# Patient Record
Sex: Female | Born: 1970 | Race: White | Hispanic: No | Marital: Married | Smoking: Never smoker
Health system: Southern US, Community
[De-identification: ages and names within clinical notes are randomized; demographics above are authoritative.]

---

## 2017-01-17 DIAGNOSIS — E785 Hyperlipidemia, unspecified: Secondary | ICD-10-CM | POA: Insufficient documentation

## 2017-10-06 ENCOUNTER — Other Ambulatory Visit: Payer: Self-pay

## 2017-10-06 ENCOUNTER — Emergency Department (HOSPITAL_COMMUNITY): Payer: Commercial Managed Care - PPO

## 2017-10-06 ENCOUNTER — Emergency Department (HOSPITAL_COMMUNITY)
Admission: EM | Admit: 2017-10-06 | Discharge: 2017-10-07 | Disposition: A | Discharge status: Home / Self Care | Payer: Commercial Managed Care - PPO | Attending: Emergency Medicine | Admitting: Emergency Medicine

## 2017-10-06 DIAGNOSIS — K2100 Gastro-esophageal reflux disease with esophagitis, without bleeding: Secondary | ICD-10-CM

## 2017-10-06 DIAGNOSIS — R079 Chest pain, unspecified: Secondary | ICD-10-CM

## 2017-10-06 LAB — CBC
HEMATOCRIT: 39 % (ref 36.0–46.0)
HEMOGLOBIN: 13.2 g/dL (ref 12.0–15.0)
MCH: 28.5 pg (ref 26.0–34.0)
MCHC: 33.8 g/dL (ref 30.0–36.0)
MCV: 84.2 fL (ref 78.0–100.0)
Platelets: 229 10*3/uL (ref 150–400)
RBC: 4.63 MIL/uL (ref 3.87–5.11)
RDW: 13.2 % (ref 11.5–15.5)
WBC: 8.4 10*3/uL (ref 4.0–10.5)

## 2017-10-06 LAB — I-STAT TROPONIN, ED: Troponin i, poc: 0 ng/mL (ref 0.00–0.08)

## 2017-10-06 LAB — I-STAT BETA HCG BLOOD, ED (MC, WL, AP ONLY)

## 2017-10-06 NOTE — ED Notes (Signed)
Blue top drawn in triage.

## 2017-10-06 NOTE — ED Triage Notes (Signed)
Pt reports intermittent chest pain starting yesterday. She states that it is a heaviness accompanied by dizziness and nausea. A&Ox4. Family hx of abnormal cardia rhythms.

## 2017-10-07 DIAGNOSIS — K210 Gastro-esophageal reflux disease with esophagitis: Secondary | ICD-10-CM | POA: Diagnosis not present

## 2017-10-07 LAB — BASIC METABOLIC PANEL
ANION GAP: 7 (ref 5–15)
BUN: 10 mg/dL (ref 6–20)
CHLORIDE: 108 mmol/L (ref 101–111)
CO2: 25 mmol/L (ref 22–32)
Calcium: 9 mg/dL (ref 8.9–10.3)
Creatinine, Ser: 0.77 mg/dL (ref 0.44–1.00)
GFR calc Af Amer: >60 mL/min (ref >60)
GFR calc non Af Amer: >60 mL/min (ref >60)
Glucose, Bld: 132 mg/dL — ABNORMAL HIGH (ref 65–99)
POTASSIUM: 3.4 mmol/L — AB (ref 3.5–5.1)
Sodium: 140 mmol/L (ref 135–145)

## 2017-10-07 LAB — I-STAT TROPONIN, ED: Troponin i, poc: 0 ng/mL (ref 0.00–0.08)

## 2017-10-07 MED ORDER — GI COCKTAIL ~~LOC~~
30.0000 mL | Freq: Once | ORAL | Status: AC
  Administered 2017-10-07: 30 mL via ORAL
  Filled 2017-10-07: qty 30

## 2017-10-07 MED ORDER — OMEPRAZOLE 20 MG PO CPDR: 20.0000 mg | DELAYED_RELEASE_CAPSULE | Freq: Two times a day (BID) | ORAL | 1 refills | Status: DC

## 2017-10-07 MED ORDER — FAMOTIDINE IN NACL 20-0.9 MG/50ML-% IV SOLN
20.0000 mg | Freq: Once | INTRAVENOUS | Status: AC
  Administered 2017-10-07: 20 mg via INTRAVENOUS
  Filled 2017-10-07: qty 50

## 2017-10-07 NOTE — Discharge Instructions (Addendum)
Prilosec twice per day until symptoms are improving. Small, frequent meals.  Avoid large meals.  No large meals or liquids 2 hours before bed If symptoms persist, contact cardiologist for outpatient appointment. Return to ER with worsening symptoms

## 2017-10-07 NOTE — ED Provider Notes (Signed)
[facility] DEPT Provider Note   CSN: [id number] Arrival date & time: 10/06/17  2244     History   Chief Complaint Chief Complaint  Patient presents with  . Chest Pain    HPI Krista Jackson is a 47 y.o. female.  Chief complaint is chest pain.  HPI 47 year old female.  No history of heart disease.  No risk factors for heart disease.  Describes 3 weeks of intermittent symptoms.  States primarily when she lays back she will get symptoms.  It is a sensation of fullness in her chest.  She will occasionally feel some lightheadedness, and sweatiness.  She had an episode in triage and her heart rate went from 7230 and resolved within 2 to 3 minutes.  She states her most significant episode was earlier tonight she laid down flat felt a sudden burning in her chest and then felt lightheadedness and sweats.  Resolved with sitting up.  No history hypertension, diabetes, lifetime non-smoker.  No history of high cholesterol, family history negative for heart disease.  Does not consume excess alcohol or caffeine, non-smoker.  No NSAID use.  No steroids or antibiotics recently.  No past medical history on file.  There are no active problems to display for this patient.     OB History   None      Home Medications    Prior to Admission medications   Medication Sig Start Date End Date Taking? Authorizing Provider  omeprazole (PRILOSEC) 20 MG capsule Take 1 capsule (20 mg total) by mouth 2 (two) times daily. 10/07/17   Tanna Furry, MD    Family History No family history on file.  Social History Social History   Tobacco Use  . Smoking status: Not on file  Substance Use Topics  . Alcohol use: Not on file  . Drug use: Not on file     Allergies   Patient has no known allergies.   Review of Systems Review of Systems  Constitutional: Positive for diaphoresis. Negative for appetite change, chills, fatigue and fever.  HENT: Negative for mouth sores,  sore throat and trouble swallowing.   Eyes: Negative for visual disturbance.  Respiratory: Negative for cough, chest tightness, shortness of breath and wheezing.   Cardiovascular: Positive for chest pain.  Gastrointestinal: Positive for nausea. Negative for abdominal distention, abdominal pain, diarrhea and vomiting.  Endocrine: Negative for polydipsia, polyphagia and polyuria.  Genitourinary: Negative for dysuria, frequency and hematuria.  Musculoskeletal: Negative for gait problem.  Skin: Negative for color change, pallor and rash.  Neurological: Positive for light-headedness. Negative for dizziness, syncope and headaches.  Hematological: Does not bruise/bleed easily.  Psychiatric/Behavioral: Negative for behavioral problems and confusion.     Physical Exam Updated Vital Signs BP 98/70   Pulse (!) 56   Temp 97.9 F (36.6 C) (Oral)   Resp 18   LMP 09/22/2017   SpO2 97%   Physical Exam  Constitutional: She is oriented to person, place, and time. She appears well-developed and well-nourished. No distress.  HENT:  Head: Normocephalic.  Eyes: Pupils are equal, round, and reactive to light. Conjunctivae are normal. No scleral icterus.  Neck: Normal range of motion. Neck supple. No thyromegaly present.  Cardiovascular: Normal rate and regular rhythm. Exam reveals no gallop and no friction rub.  No murmur heard. Pulmonary/Chest: Effort normal and breath sounds normal. No respiratory distress. She has no wheezes. She has no rales.  Abdominal: Soft. Bowel sounds are normal. She exhibits no distension. There is no  tenderness. There is no rebound.  Musculoskeletal: Normal range of motion.  Neurological: She is alert and oriented to person, place, and time.  Skin: Skin is warm and dry. No rash noted.  Psychiatric: She has a normal mood and affect. Her behavior is normal.     ED Treatments / Results  Labs (all labs ordered are listed, but only abnormal results are displayed) Labs  Reviewed  BASIC METABOLIC PANEL - Abnormal; Notable for the following components:      Result Value   Potassium 3.4 (*)    Glucose, Bld 132 (*)    All other components within normal limits  CBC  I-STAT TROPONIN, ED  I-STAT BETA HCG BLOOD, ED ([facility], [facility], [facility] ONLY)  I-STAT TROPONIN, ED    EKG EKG Interpretation  Date/Time:  Friday Oct 06 2017 23:00:44 EDT Ventricular Rate:  89 PR Interval:    QRS Duration: 86 QT Interval:  390 QTC Calculation: 475 R Axis:   76 Text Interpretation:  Sinus rhythm Confirmed by Tanna Furry [phone]) on 10/06/2017 11:23:11 PM   Radiology Dg Chest 2 View  Result Date: 10/06/2017 CLINICAL DATA:  47 year old female with chest pain and shortness of breath. EXAM: CHEST - 2 VIEW COMPARISON:  None. FINDINGS: There is mild centrilobular emphysema and chronic bronchitic changes. No focal consolidation, pleural effusion, or pneumothorax. Biapical pleural thickening. The cardiac silhouette is within normal limits. No acute osseous pathology. IMPRESSION: No active cardiopulmonary disease. Electronically Signed   By: Anner Crete M.D.   On: 10/06/2017 23:40    Procedures Procedures (including critical care time)  Medications Ordered in ED Medications  gi cocktail (Maalox,Lidocaine,Donnatal) (30 mLs Oral Given 10/07/17 0246)  famotidine (PEPCID) IVPB 20 mg premix (0 mg Intravenous Stopped 10/07/17 0345)     Initial Impression / Assessment and Plan / ED Course  I have reviewed the triage vital signs and the nursing notes.  Pertinent labs & imaging results that were available during my care of the patient were reviewed by me and considered in my medical decision making (see chart for details).    EKG Interpretation  Date/Time:  Friday Oct 06 2017 23:00:44 EDT Ventricular Rate:  89 PR Interval:    QRS Duration: 86 QT Interval:  390 QTC Calculation: 475 R Axis:   76 Text Interpretation:  Sinus rhythm Confirmed by Tanna Furry [phone]) on 10/06/2017 11:23:11  PM   Initial, and second troponin negative.  Chest x-ray and labs negative.  Symptoms consistent with reflux and likely vagal syndrome.  Heart score 0.  Plan is discharge home.  Primary care or cardiologist symptoms worsen.  Given GI cocktail and IV Protonix here.  Will continue on Prilosec at home.  Recheck to ER with worsening.   Final Clinical Impressions(s) / ED Diagnoses   Final diagnoses:  Chest pain, unspecified type  Gastroesophageal reflux disease with esophagitis    ED Discharge Orders        Ordered    omeprazole (PRILOSEC) 20 MG capsule  2 times daily     10/07/17 0231       Tanna Furry, MD 10/07/17 [phone]

## 2017-10-07 NOTE — ED Notes (Signed)
Pt has been informed and provided discharge instructions. Currently waiting for Pepcid to complete

## 2017-11-03 DIAGNOSIS — Z0000 Encounter for general adult medical examination without abnormal findings: Secondary | ICD-10-CM | POA: Diagnosis not present

## 2017-11-06 DIAGNOSIS — Z0000 Encounter for general adult medical examination without abnormal findings: Secondary | ICD-10-CM | POA: Diagnosis not present

## 2017-11-06 DIAGNOSIS — M25512 Pain in left shoulder: Secondary | ICD-10-CM | POA: Diagnosis not present

## 2017-11-06 DIAGNOSIS — R7301 Impaired fasting glucose: Secondary | ICD-10-CM | POA: Diagnosis not present

## 2017-11-06 DIAGNOSIS — E785 Hyperlipidemia, unspecified: Secondary | ICD-10-CM | POA: Diagnosis not present

## 2017-11-06 DIAGNOSIS — Z124 Encounter for screening for malignant neoplasm of cervix: Secondary | ICD-10-CM | POA: Diagnosis not present

## 2017-11-06 DIAGNOSIS — Z1231 Encounter for screening mammogram for malignant neoplasm of breast: Secondary | ICD-10-CM | POA: Diagnosis not present

## 2017-11-06 LAB — HM PAP SMEAR: HM Pap smear: NEGATIVE

## 2017-11-29 DIAGNOSIS — M25512 Pain in left shoulder: Secondary | ICD-10-CM | POA: Diagnosis not present

## 2017-11-29 DIAGNOSIS — M542 Cervicalgia: Secondary | ICD-10-CM | POA: Diagnosis not present

## 2017-11-29 DIAGNOSIS — M7522 Bicipital tendinitis, left shoulder: Secondary | ICD-10-CM | POA: Diagnosis not present

## 2018-01-02 DIAGNOSIS — M25512 Pain in left shoulder: Secondary | ICD-10-CM | POA: Diagnosis not present

## 2018-01-02 DIAGNOSIS — M6281 Muscle weakness (generalized): Secondary | ICD-10-CM | POA: Diagnosis not present

## 2018-01-19 DIAGNOSIS — M25512 Pain in left shoulder: Secondary | ICD-10-CM | POA: Diagnosis not present

## 2018-01-19 DIAGNOSIS — Z1231 Encounter for screening mammogram for malignant neoplasm of breast: Secondary | ICD-10-CM | POA: Diagnosis not present

## 2018-01-19 DIAGNOSIS — M6281 Muscle weakness (generalized): Secondary | ICD-10-CM | POA: Diagnosis not present

## 2018-01-19 LAB — HM MAMMOGRAPHY

## 2018-01-25 DIAGNOSIS — L03012 Cellulitis of left finger: Secondary | ICD-10-CM | POA: Diagnosis not present

## 2018-07-09 ENCOUNTER — Ambulatory Visit: Payer: Commercial Managed Care - PPO | Admitting: Nurse Practitioner

## 2018-07-13 ENCOUNTER — Encounter: Payer: Self-pay | Admitting: Nurse Practitioner

## 2018-07-13 ENCOUNTER — Ambulatory Visit: Payer: Commercial Managed Care - PPO | Admitting: Nurse Practitioner

## 2018-07-13 VITALS — BP 110/64 | HR 74 | Temp 97.9°F | Ht 68.0 in | Wt 175.0 lb

## 2018-07-13 DIAGNOSIS — G8929 Other chronic pain: Secondary | ICD-10-CM | POA: Insufficient documentation

## 2018-07-13 DIAGNOSIS — M5441 Lumbago with sciatica, right side: Secondary | ICD-10-CM

## 2018-07-13 DIAGNOSIS — M5442 Lumbago with sciatica, left side: Secondary | ICD-10-CM | POA: Diagnosis not present

## 2018-07-13 NOTE — Patient Instructions (Signed)
Let me know if you change your mind about lumbar x-ray and physical therapy.  Stretch before and after any exercise routine.   Core Strength Exercises  Core exercises help to build strength in the muscles between your ribs and your hips (abdominal muscles). These muscles help to support your body and keep your spine stable. It is important to maintain strength in your core to prevent injury and pain. Some activities, such as yoga and Pilates, can help to strengthen core muscles. You can also strengthen core muscles with exercises at home. It is important to talk to your health care provider before you start a new exercise routine. What are the benefits of core strength exercises? Core strength exercises can:  Reduce back pain.  Help to rebuild strength after a back or spine injury.  Help to prevent injury during physical activity, especially injuries to the back and knees. How to do core strength exercises Repeat these exercises 10-15 times, or until you are tired. Do exercises exactly as told by your health care provider and adjust them as directed. It is normal to feel mild stretching, pulling, tightness, or discomfort as you do these exercises. If you feel any pain while doing these exercises, stop. If your pain continues or gets worse when doing core exercises, contact your health care provider. You may want to use a padded yoga or exercise mat for strength exercises that are done on the floor. Bridging  1. Lie on your back on a firm surface with your knees bent and your feet flat on the floor. 2. Raise your hips so that your knees, hips, and shoulders form a straight line together. Keep your abdominal muscles tight. 3. Hold this position for 3-5 seconds. 4. Slowly lower your hips to the starting position. 5. Let your muscles relax completely between repetitions. Single-leg bridge 1. Lie on your back on a firm surface with your knees bent and your feet flat on the floor. 2. Raise  your hips so that your knees, hips, and shoulders form a straight line together. Keep your abdominal muscles tight. 3. Lift one foot off the floor, then completely straighten that leg. 4. Hold this position for 3-5 seconds. 5. Put the straight leg back down in the bent position. 6. Slowly lower your hips to the starting position. 7. Repeat these steps using your other leg. Side bridge 1. Lie on your side with your knees bent. Prop yourself up on the elbow that is near the floor. 2. Using your abdominal muscles and your elbow that is on the floor, raise your body off the floor. Raise your hip so that your shoulder, hip, and foot form a straight line together. 3. Hold this position for 10 seconds. Keep your head and neck raised and away from your shoulder (in their normal, neutral position). Keep your abdominal muscles tight. 4. Slowly lower your hip to the starting position. 5. Repeat and try to hold this position longer, working your way up to 30 seconds. Abdominal crunch 1. Lie on your back on a firm surface. Bend your knees and keep your feet flat on the floor. 2. Cross your arms over your chest. 3. Without bending your neck, tip your chin slightly toward your chest. 4. Tighten your abdominal muscles as you lift your chest just high enough to lift your shoulder blades off of the floor. Do not hold your breath. You can do this with short lifts or long lifts. 5. Slowly return to the starting position. Bird dog  1. Get on your hands and knees, with your legs shoulder-width apart and your arms under your shoulders. Keep your back straight. 2. Tighten your abdominal muscles. 3. Raise one of your legs off the floor and straighten it. Try to keep it parallel to the floor. 4. Slowly lower your leg to the starting position. 5. Raise one of your arms off the floor and straighten it. Try to keep it parallel to the floor. 6. Slowly lower your arm to the starting position. 7. Repeat with the other arm  and leg. If possible, try raising a leg and arm at the same time, on opposite sides of the body. For example, raise your left hand and your right leg. Plank 1. Lie on your belly. 2. Prop up your body onto your forearms and your feet, keeping your legs straight. Your body should make a straight line between your shoulders and feet. 3. Hold this position for 10 seconds while keeping your abdominal muscles tight. 4. Lower your body to the starting position. 5. Repeat and try to hold this position longer, working your way up to 30 seconds. Cross-core strengthening 1. Stand with your feet shoulder-width apart. 2. Hold a ball out in front of you. Keep your arms straight. 3. Tighten your abdominal muscles and slowly rotate at your waist from side to side. Keep your feet flat. 4. Once you are comfortable, try repeating this exercise with a heavier ball. Top core strengthening 1. Stand about 18 inches (46 cm) in front of a wall, with your back to the wall. 2. Keep your feet flat and shoulder-width apart. 3. Tighten your abdominal muscles. 4. Bend your hips and knees. 5. Slowly reach between your legs to touch the wall behind you. 6. Slowly stand back up. 7. Raise your arms over your head and reach behind you. 8. Return to the starting position. General tips  Do not do any exercises that cause pain. If you have pain while exercising, talk to your health care provider.  Always stretch before and after doing these exercises. This can help prevent injury.  Maintain a healthy weight. Ask your health care provider what weight is healthy for you. Contact a health care provider if:  You have back pain that gets worse or does not go away.  You feel pain while doing core strength exercises. Get help right away if:  You have severe pain that does not get better with medicine. Summary  Core exercises help to build strength in the muscles between your ribs and your waist.  Core muscles help to  support your body and keep your spine stable.  Some activities, such as yoga and Pilates, can help to strengthen core muscles.  Core strength exercises can help back pain and can prevent injury.  If you feel any pain while doing core strength exercises, stop. This information is not intended to replace advice given to you by your health care provider. Make sure you discuss any questions you have with your health care provider. Document Released: 10/12/2016 Document Revised: 10/12/2016 Document Reviewed: 10/12/2016 Elsevier Interactive Patient Education  2019 [employer].

## 2018-07-13 NOTE — Progress Notes (Signed)
Subjective:  Patient ID: Krista Jackson, female    DOB: 15-Apr-1971  Age: 48 y.o. MRN: [id number]  CC: Establish Care (est care/just want to est care with provider/ FYi- [facility] in [place])   HPI  hx of irregular menstrual cycle, every 47month, 7days long, clots (biggest is about a quarter) No GYN. Gyn last seen 7days Last PAP doen 277yrago. No hx of abnormal PAP. LMP 05/27/2018. Last mammogram 2019: normal per patient  Chronic Back pain: Injury [id number], pulling mulch in her garden Waxing and waning muscles spasm with exertion or increase activity. Bilateral Radiculopathy in last [id number] in 5th toes intermittent. Some improvement with change in mattress and biofreeze/ Triggered with long car ride.. Stiffness in prolong sitting or laying.  Reviewed past Medical, Social and Family history today.  Outpatient Medications Prior to Visit  Medication Sig Dispense Refill  . omeprazole (PRILOSEC) 20 MG capsule Take 1 capsule (20 mg total) by mouth 2 (two) times daily. (Patient not taking: Reported on 07/13/2018) 60 capsule 1   No facility-administered medications prior to visit.     ROS See HPI  Objective:  BP 110/64   Pulse 74   Temp 97.9 F (36.6 C) (Oral)   Ht 5' 8"  (1.727 m)   Wt 175 lb (79.4 kg)   SpO2 97%   BMI 26.61 kg/m   BP Readings from Last 3 Encounters:  07/13/18 110/64  10/07/17 98/70    Wt Readings from Last 3 Encounters:  07/13/18 175 lb (79.4 kg)    Physical Exam Cardiovascular:     Rate and Rhythm: Normal rate and regular rhythm.     Pulses: Normal pulses.     Heart sounds: Normal heart sounds.  Pulmonary:     Effort: Pulmonary effort is normal.     Breath sounds: Normal breath sounds.  Abdominal:     General: Bowel sounds are normal.     Palpations: Abdomen is soft.  Musculoskeletal:        General: No tenderness or deformity.     Right lower leg: No edema.     Left lower leg: No edema.  Lymphadenopathy:     Cervical: No cervical  adenopathy.  Skin:    Findings: No erythema or rash.  Neurological:     Mental Status: She is alert and oriented to person, place, and time.     Cranial Nerves: No cranial nerve deficit.     Sensory: No sensory deficit.     Motor: No weakness.     Coordination: Coordination normal.     Gait: Gait normal.     Deep Tendon Reflexes: Reflexes normal.  Psychiatric:        Mood and Affect: Mood normal.        Behavior: Behavior normal.     Lab Results  Component Value Date   WBC 8.4 10/06/2017   HGB 13.2 10/06/2017   HCT 39.0 10/06/2017   PLT 229 10/06/2017   GLUCOSE 132 (H) 10/06/2017   NA 140 10/06/2017   K 3.4 (L) 10/06/2017   CL 108 10/06/2017   CREATININE 0.77 10/06/2017   BUN 10 10/06/2017   CO2 25 10/06/2017    Dg Chest 2 View  Result Date: 10/06/2017 CLINICAL DATA:  46 y3r old female with chest pain and shortness of breath. EXAM: CHEST - 2 VIEW COMPARISON:  None. FINDINGS: There is mild centrilobular emphysema and chronic bronchitic changes. No focal consolidation, pleural effusion, or pneumothorax. Biapical pleural thickening. The cardiac silhouette is within  normal limits. No acute osseous pathology. IMPRESSION: No active cardiopulmonary disease. Electronically Signed   By: Anner Crete M.D.   On: 10/06/2017 23:40    Assessment & Plan:  Advised about need for Lumbar Spine X-ray and possible need for NSAIDs, muscle relaxants and physical therapy. She declined statin she bought a new mattress which seems to be helping at this time. She will like to wait for now.  Krista Jackson was seen today for establish care.  Diagnoses and all orders for this visit:  Chronic bilateral low back pain with bilateral sciatica   I am having Krista Jackson maintain her omeprazole.  No orders of the defined types were placed in this encounter.   Problem List Items Addressed This Visit      Nervous and Auditory   Chronic bilateral low back pain with bilateral sciatica - Primary         Follow-up: Return in about 6 months (around 01/11/2019) for CPE(fasting).  Krista Lacy, NP

## 2018-07-16 ENCOUNTER — Encounter: Payer: Self-pay | Admitting: Nurse Practitioner

## 2018-07-24 ENCOUNTER — Ambulatory Visit: Payer: Commercial Managed Care - PPO | Admitting: Family Medicine

## 2019-01-10 ENCOUNTER — Other Ambulatory Visit: Payer: Self-pay

## 2019-01-11 ENCOUNTER — Ambulatory Visit (INDEPENDENT_AMBULATORY_CARE_PROVIDER_SITE_OTHER): Payer: Commercial Managed Care - PPO

## 2019-01-11 ENCOUNTER — Ambulatory Visit (INDEPENDENT_AMBULATORY_CARE_PROVIDER_SITE_OTHER): Payer: Commercial Managed Care - PPO | Admitting: Nurse Practitioner

## 2019-01-11 ENCOUNTER — Encounter: Payer: Self-pay | Admitting: Nurse Practitioner

## 2019-01-11 VITALS — BP 112/78 | HR 62 | Temp 97.7°F | Ht 68.0 in | Wt 178.0 lb

## 2019-01-11 DIAGNOSIS — M5441 Lumbago with sciatica, right side: Secondary | ICD-10-CM

## 2019-01-11 DIAGNOSIS — M5442 Lumbago with sciatica, left side: Secondary | ICD-10-CM

## 2019-01-11 DIAGNOSIS — R7301 Impaired fasting glucose: Secondary | ICD-10-CM

## 2019-01-11 DIAGNOSIS — E782 Mixed hyperlipidemia: Secondary | ICD-10-CM | POA: Diagnosis not present

## 2019-01-11 DIAGNOSIS — Z0001 Encounter for general adult medical examination with abnormal findings: Secondary | ICD-10-CM

## 2019-01-11 DIAGNOSIS — G8929 Other chronic pain: Secondary | ICD-10-CM

## 2019-01-11 LAB — LIPID PANEL
Cholesterol: 207 mg/dL — ABNORMAL HIGH (ref 0–200)
HDL: 49.8 mg/dL (ref >39.00)
LDL Cholesterol: 132 mg/dL — ABNORMAL HIGH (ref 0–99)
NonHDL: 157.16
Total CHOL/HDL Ratio: 4
Triglycerides: 128 mg/dL (ref 0.0–149.0)
VLDL: 25.6 mg/dL (ref 0.0–40.0)

## 2019-01-11 LAB — COMPREHENSIVE METABOLIC PANEL
ALT: 19 U/L (ref 0–35)
AST: 20 U/L (ref 0–37)
Albumin: 4.2 g/dL (ref 3.5–5.2)
Alkaline Phosphatase: 62 U/L (ref 39–117)
BUN: 11 mg/dL (ref 6–23)
CO2: 27 mEq/L (ref 19–32)
Calcium: 9 mg/dL (ref 8.4–10.5)
Chloride: 104 mEq/L (ref 96–112)
Creatinine, Ser: 0.78 mg/dL (ref 0.40–1.20)
GFR: 79.55 mL/min (ref >60.00)
Glucose, Bld: 104 mg/dL — ABNORMAL HIGH (ref 70–99)
Potassium: 5.1 mEq/L (ref 3.5–5.1)
Sodium: 137 mEq/L (ref 135–145)
Total Bilirubin: 0.5 mg/dL (ref 0.2–1.2)
Total Protein: 6.7 g/dL (ref 6.0–8.3)

## 2019-01-11 LAB — CBC
HCT: 41.5 % (ref 36.0–46.0)
Hemoglobin: 13.8 g/dL (ref 12.0–15.0)
MCHC: 33.3 g/dL (ref 30.0–36.0)
MCV: 84.9 fl (ref 78.0–100.0)
Platelets: 237 10*3/uL (ref 150.0–400.0)
RBC: 4.88 Mil/uL (ref 3.87–5.11)
RDW: 13.5 % (ref 11.5–15.5)
WBC: 6.5 10*3/uL (ref 4.0–10.5)

## 2019-01-11 LAB — TSH: TSH: 1.76 u[IU]/mL (ref 0.35–4.50)

## 2019-01-11 LAB — HEMOGLOBIN A1C: Hgb A1c MFr Bld: 5.6 % (ref 4.6–6.5)

## 2019-01-11 NOTE — Patient Instructions (Addendum)
Normal CMP, TSH and CBC. Lipid panel indicates mild elevation in total cholesterol and LDL. Your risk for developing cardiovascular disease in next 10years is 0.8%. Therefore at this time heart healthy diet and regular exercise is recommended at this time.  Lumbar x-ray indicates: Arthritic change at L5-S1 with narrowing disc space. This could explain intermittent radicular symptoms. I recommend use of use of NSAIDs or oral steriods, use of muscle relaxant, and referral for physical therapy.  Let me know if you want to proceed with above recommendation   Health Maintenance, Female Adopting a healthy lifestyle and getting preventive care are important in promoting health and wellness. Ask your health care provider about:  The right schedule for you to have regular tests and exams.  Things you can do on your own to prevent diseases and keep yourself healthy. What should I know about diet, weight, and exercise? Eat a healthy diet   Eat a diet that includes plenty of vegetables, fruits, low-fat dairy products, and lean protein.  Do not eat a lot of foods that are high in solid fats, added sugars, or sodium. Maintain a healthy weight Body mass index (BMI) is used to identify weight problems. It estimates body fat based on height and weight. Your health care provider can help determine your BMI and help you achieve or maintain a healthy weight. Get regular exercise Get regular exercise. This is one of the most important things you can do for your health. Most adults should:  Exercise for at least 150 minutes each week. The exercise should increase your heart rate and make you sweat (moderate-intensity exercise).  Do strengthening exercises at least twice a week. This is in addition to the moderate-intensity exercise.  Spend less time sitting. Even light physical activity can be beneficial. Watch cholesterol and blood lipids Have your blood tested for lipids and cholesterol at 46 years of  age, then have this test every 5 years. Have your cholesterol levels checked more often if:  Your lipid or cholesterol levels are high.  You are older than 46 years of age.  You are at high risk for heart disease. What should I know about cancer screening? Depending on your health history and family history, you may need to have cancer screening at various ages. This may include screening for:  Breast cancer.  Cervical cancer.  Colorectal cancer.  Skin cancer.  Lung cancer. What should I know about heart disease, diabetes, and high blood pressure? Blood pressure and heart disease  High blood pressure causes heart disease and increases the risk of stroke. This is more likely to develop in people who have high blood pressure readings, are of African descent, or are overweight.  Have your blood pressure checked: ? Every 3-5 years if you are 75-82 years of age. ? Every year if you are 58 years old or older. Diabetes Have regular diabetes screenings. This checks your fasting blood sugar level. Have the screening done:  Once every three years after age 29 if you are at a normal weight and have a low risk for diabetes.  More often and at a younger age if you are overweight or have a high risk for diabetes. What should I know about preventing infection? Hepatitis B If you have a higher risk for hepatitis B, you should be screened for this virus. Talk with your health care provider to find out if you are at risk for hepatitis B infection. Hepatitis C Testing is recommended for:  Everyone born from  1943 through [birth year].  Anyone with known risk factors for hepatitis C. Sexually transmitted infections (STIs)  Get screened for STIs, including gonorrhea and chlamydia, if: ? You are sexually active and are younger than 46 years of age. ? You are older than 46 years of age and your health care provider tells you that you are at risk for this type of infection. ? Your sexual activity has  changed since you were last screened, and you are at increased risk for chlamydia or gonorrhea. Ask your health care provider if you are at risk.  Ask your health care provider about whether you are at high risk for HIV. Your health care provider may recommend a prescription medicine to help prevent HIV infection. If you choose to take medicine to prevent HIV, you should first get tested for HIV. You should then be tested every 3 months for as long as you are taking the medicine. Pregnancy  If you are about to stop having your period (premenopausal) and you may become pregnant, seek counseling before you get pregnant.  Take 400 to 800 micrograms (mcg) of folic acid every day if you become pregnant.  Ask for birth control (contraception) if you want to prevent pregnancy. Osteoporosis and menopause Osteoporosis is a disease in which the bones lose minerals and strength with aging. This can result in bone fractures. If you are 33 years old or older, or if you are at risk for osteoporosis and fractures, ask your health care provider if you should:  Be screened for bone loss.  Take a calcium or vitamin D supplement to lower your risk of fractures.  Be given hormone replacement therapy (HRT) to treat symptoms of menopause. Follow these instructions at home: Lifestyle  Do not use any products that contain nicotine or tobacco, such as cigarettes, e-cigarettes, and chewing tobacco. If you need help quitting, ask your health care provider.  Do not use street drugs.  Do not share needles.  Ask your health care provider for help if you need support or information about quitting drugs. Alcohol use  Do not drink alcohol if: ? Your health care provider tells you not to drink. ? You are pregnant, may be pregnant, or are planning to become pregnant.  If you drink alcohol: ? Limit how much you use to 0-1 drink a day. ? Limit intake if you are breastfeeding.  Be aware of how much alcohol is in  your drink. In the [place] one drink equals one 12 oz bottle of beer (355 mL), one 5 oz glass of wine (148 mL), or one 1 oz glass of hard liquor (44 mL). General instructions  Schedule regular health, dental, and eye exams.  Stay current with your vaccines.  Tell your health care provider if: ? You often feel depressed. ? You have ever been abused or do not feel safe at home. Summary  Adopting a healthy lifestyle and getting preventive care are important in promoting health and wellness.  Follow your health care provider's instructions about healthy diet, exercising, and getting tested or screened for diseases.  Follow your health care provider's instructions on monitoring your cholesterol and blood pressure. This information is not intended to replace advice given to you by your health care provider. Make sure you discuss any questions you have with your health care provider. Document Released: 12/06/2010 Document Revised: 05/16/2018 Document Reviewed: 05/16/2018 Elsevier Patient Education  2020 [employer].

## 2019-01-11 NOTE — Progress Notes (Signed)
Subjective:    Patient ID: Krista Jackson, female    DOB: [birth year], 48 y.o.   MRN: [id number]  Patient presents today for complete physical and re eval of back pain  Back Pain This is a chronic problem. The current episode started more than 1 year ago. The problem occurs constantly. The problem has been waxing and waning since onset. The pain is present in the lumbar spine. The quality of the pain is described as aching, burning and shooting. The pain radiates to the right thigh and right foot. The symptoms are aggravated by sitting and lying down. Associated symptoms include perianal numbness and tingling. Pertinent negatives include no abdominal pain, bladder incontinence, bowel incontinence, chest pain, dysuria, fever, headaches, numbness, paresthesias, pelvic pain, weakness or weight loss. Risk factors include lack of exercise and poor posture. She has tried home exercises for the symptoms. The treatment provided mild relief.  also reports intermittent saddle paresthesia.  Sexual History (orientation,birth control, marital status, STD):married sexuall active, up to date for PAP, will schedule mammogram  Depression/Suicide: Depression screen [facility] 2/9 07/13/2018  Decreased Interest 0  Down, Depressed, Hopeless 0  PHQ - 2 Score 0   Vision:up to date, use of corrective lens  Dental:up to date  Immunizations: (TDAP, Hep C screen, Pneumovax, Influenza, zoster)  Health Maintenance  Topic Date Due  . Flu Shot  01/05/2019  . HIV Screening  01/11/2020*  . Pap Smear  11/06/2020  . Tetanus Vaccine  01/24/2027  *Topic was postponed. The date shown is not the original due date.   Diet:regular.  Weight:  Wt Readings from Last 3 Encounters:  01/11/19 178 lb (80.7 kg)  07/13/18 175 lb (79.4 kg)   Exercise:none  Fall Risk: Fall Risk  07/13/2018  Falls in the past year? 0   Medications and allergies reviewed with patient and updated if appropriate.  Patient Active Problem List   Diagnosis Date Noted  . Chronic bilateral low back pain with bilateral sciatica 07/13/2018  . IFG (impaired fasting glucose) 11/06/2017  . Hyperlipidemia, unspecified 01/17/2017    Current Outpatient Medications on File Prior to Visit  Medication Sig Dispense Refill  . Multiple Vitamin (MULTIVITAMIN) tablet Take 1 tablet by mouth daily.    . Omega-3 Fatty Acids (FISH OIL) 1200 MG CAPS Take by mouth. otc    . omeprazole (PRILOSEC) 20 MG capsule Take 1 capsule (20 mg total) by mouth 2 (two) times daily. 60 capsule 1   No current facility-administered medications on file prior to visit.     No past medical history on file.  No past surgical history on file.  Social History   Socioeconomic History  . Marital status: Married    Spouse name: Not on file  . Number of children: Not on file  . Years of education: Not on file  . Highest education level: Not on file  Occupational History  . Not on file  Social Needs  . Financial resource strain: Not on file  . Food insecurity    Worry: Not on file    Inability: Not on file  . Transportation needs    Medical: Not on file    Non-medical: Not on file  Tobacco Use  . Smoking status: Never Smoker  . Smokeless tobacco: Never Used  Substance and Sexual Activity  . Alcohol use: Not Currently    Frequency: Never  . Drug use: Never  . Sexual activity: Yes    Birth control/protection: Surgical    Comment: husband  had vasectomy  Lifestyle  . Physical activity    Days per week: Not on file    Minutes per session: Not on file  . Stress: Not on file  Relationships  . Social Herbalist on phone: Not on file    Gets together: Not on file    Attends religious service: Not on file    Active member of club or organization: Not on file    Attends meetings of clubs or organizations: Not on file    Relationship status: Not on file  Other Topics Concern  . Not on file  Social History Narrative  . Not on file    Family History   Problem Relation Age of Onset  . Hyperlipidemia Mother   . Arthritis Mother   . Depression Mother   . Miscarriages / [place] Mother   . Diabetes Father   . Hyperlipidemia Father   . Cancer Paternal Aunt 52       breast cancer  . Hyperlipidemia Maternal Grandmother   . Stroke Maternal Grandmother   . Heart disease Maternal Grandmother   . Heart disease Maternal Grandfather   . Heart attack Maternal Grandfather   . Cancer Paternal Grandmother 80       breast cancer  . Heart disease Paternal Grandfather   . Hyperlipidemia Paternal Grandfather   . Hearing loss Paternal Grandfather   . Heart attack Paternal Grandfather         Review of Systems  Constitutional: Negative for fever, malaise/fatigue and weight loss.  HENT: Negative for congestion and sore throat.   Eyes:       Negative for visual changes  Respiratory: Negative for cough and shortness of breath.   Cardiovascular: Negative for chest pain, palpitations and leg swelling.  Gastrointestinal: Negative for abdominal pain, blood in stool, bowel incontinence, constipation, diarrhea, heartburn and nausea.  Genitourinary: Negative for bladder incontinence, dysuria, frequency, pelvic pain and urgency.  Musculoskeletal: Positive for back pain. Negative for falls, joint pain and myalgias.  Skin: Negative for rash.  Neurological: Positive for tingling. Negative for dizziness, sensory change, weakness, numbness, headaches and paresthesias.  Endo/Heme/Allergies: Does not bruise/bleed easily.  Psychiatric/Behavioral: Negative.  Negative for depression, hallucinations, substance abuse and suicidal ideas. The patient is not nervous/anxious and does not have insomnia.     Objective:   Vitals:   01/11/19 0828  BP: 112/78  Pulse: 62  Temp: 97.7 F (36.5 C)  SpO2: 97%    Body mass index is 27.06 kg/m.   Physical Examination:  Physical Exam Vitals signs reviewed.  Constitutional:      General: She is not in acute  distress.    Appearance: She is well-developed.  HENT:     Head: Normocephalic.     Right Ear: Tympanic membrane, ear canal and external ear normal.     Left Ear: Tympanic membrane, ear canal and external ear normal.     Mouth/Throat:     Pharynx: No oropharyngeal exudate.  Eyes:     Extraocular Movements: Extraocular movements intact.     Conjunctiva/sclera: Conjunctivae normal.  Neck:     Musculoskeletal: Normal range of motion and neck supple.  Cardiovascular:     Rate and Rhythm: Normal rate and regular rhythm.     Pulses: Normal pulses.     Heart sounds: Normal heart sounds.  Pulmonary:     Effort: Pulmonary effort is normal. No respiratory distress.     Breath sounds: Normal breath sounds.  Chest:  Chest wall: No tenderness.     Comments: Declined breast exam Abdominal:     General: Bowel sounds are normal.     Palpations: Abdomen is soft.  Genitourinary:    Comments: Deferred to next year by patient Musculoskeletal: Normal range of motion.     Right hip: Normal.     Left hip: Normal.     Right knee: Normal.     Left knee: Normal.     Right ankle: Normal.     Left ankle: Normal.     Lumbar back: Normal.     Right upper leg: Normal.     Left upper leg: Normal.     Right lower leg: Normal.     Left lower leg: Normal.     Right foot: Normal.     Left foot: Normal.  Lymphadenopathy:     Cervical: No cervical adenopathy.  Skin:    General: Skin is warm and dry.  Neurological:     Mental Status: She is alert and oriented to person, place, and time.     Motor: No weakness.     Coordination: Coordination is intact.     Gait: Gait normal.     Deep Tendon Reflexes: Reflexes are normal and symmetric.     Reflex Scores:      Patellar reflexes are 2+ on the right side and 2+ on the left side.      Achilles reflexes are 2+ on the right side and 2+ on the left side. Psychiatric:        Mood and Affect: Mood normal.        Behavior: Behavior normal.        Thought  Content: Thought content normal.    ASSESSMENT and PLAN:  Chatara was seen today for annual exam.  Diagnoses and all orders for this visit:  Encounter for preventative adult health care exam with abnormal findings -     Comprehensive metabolic panel -     CBC -     TSH  IFG (impaired fasting glucose) -     Hemoglobin A1c  Mixed hyperlipidemia -     Lipid panel  Chronic bilateral low back pain with bilateral sciatica -     DG Lumbar Spine Complete    Hyperlipidemia, unspecified Lipid panel indicates mild elevation in total cholesterol and LDL. Your risk for developing cardiovascular disease in next 10years is 0.8%. Therefore at this time heart healthy diet and regular exercise is recommended at this time.  Lipid Panel     Component Value Date/Time   CHOL 207 (H) 01/11/2019 0915   TRIG 128.0 01/11/2019 0915   HDL 49.80 01/11/2019 0915   CHOLHDL 4 01/11/2019 0915   VLDL 25.6 01/11/2019 0915   LDLCALC 132 (H) 01/11/2019 0915    Chronic bilateral low back pain with bilateral sciatica Lumbar x-ray indicates: Arthritic change at L5-S1 with narrowing disc space. This could explain intermittent radicular symptoms. I recommend use of use of NSAIDs or oral steriods, use of muscle relaxant, and referral for physical therapy.  Let me know if you want to proceed with above recommendation      Problem List Items Addressed This Visit      Endocrine   IFG (impaired fasting glucose)   Relevant Orders   Hemoglobin A1c (Completed)     Nervous and Auditory   Chronic bilateral low back pain with bilateral sciatica    Lumbar x-ray indicates: Arthritic change at L5-S1 with narrowing disc space. This  could explain intermittent radicular symptoms. I recommend use of use of NSAIDs or oral steriods, use of muscle relaxant, and referral for physical therapy.  Let me know if you want to proceed with above recommendation      Relevant Orders   DG Lumbar Spine Complete (Completed)      Other   Hyperlipidemia, unspecified    Lipid panel indicates mild elevation in total cholesterol and LDL. Your risk for developing cardiovascular disease in next 10years is 0.8%. Therefore at this time heart healthy diet and regular exercise is recommended at this time.  Lipid Panel     Component Value Date/Time   CHOL 207 (H) 01/11/2019 0915   TRIG 128.0 01/11/2019 0915   HDL 49.80 01/11/2019 0915   CHOLHDL 4 01/11/2019 0915   VLDL 25.6 01/11/2019 0915   LDLCALC 132 (H) 01/11/2019 0915        Relevant Orders   Lipid panel (Completed)    Other Visit Diagnoses    Encounter for preventative adult health care exam with abnormal findings    -  Primary   Relevant Orders   Comprehensive metabolic panel (Completed)   CBC (Completed)   TSH (Completed)      Follow up: Return if symptoms worsen or fail to improve.  Wilfred Lacy, NP

## 2019-01-13 NOTE — Assessment & Plan Note (Signed)
Lumbar x-ray indicates: Arthritic change at L5-S1 with narrowing disc space. This could explain intermittent radicular symptoms. I recommend use of use of NSAIDs or oral steriods, use of muscle relaxant, and referral for physical therapy.  Let me know if you want to proceed with above recommendation

## 2019-01-13 NOTE — Assessment & Plan Note (Signed)
Lipid panel indicates mild elevation in total cholesterol and LDL. Your risk for developing cardiovascular disease in next 10years is 0.8%. Therefore at this time heart healthy diet and regular exercise is recommended at this time.  Lipid Panel     Component Value Date/Time   CHOL 207 (H) 01/11/2019 0915   TRIG 128.0 01/11/2019 0915   HDL 49.80 01/11/2019 0915   CHOLHDL 4 01/11/2019 0915   VLDL 25.6 01/11/2019 0915   LDLCALC 132 (H) 01/11/2019 0915

## 2019-01-17 ENCOUNTER — Encounter: Payer: Self-pay | Admitting: Nurse Practitioner

## 2019-05-24 ENCOUNTER — Encounter: Payer: Self-pay | Admitting: Nurse Practitioner

## 2019-05-24 ENCOUNTER — Telehealth: Payer: Self-pay

## 2019-05-24 DIAGNOSIS — Z111 Encounter for screening for respiratory tuberculosis: Secondary | ICD-10-CM

## 2019-05-24 NOTE — Telephone Encounter (Signed)
LMTCB. Need to know reason for this request.  Copied from [place] [id number]. Topic: General - Other >> May 24, 2019  8:17 AM Leward Quan A wrote: Reason for CRM: Patient called to request an appointment for a Quantiferon gold test asking if Krista Jackson can please place an order for her. She was scheduled for 06/05/2019 at 8 AM. Please advise

## 2019-05-26 ENCOUNTER — Encounter: Payer: Self-pay | Admitting: Nurse Practitioner

## 2019-05-28 NOTE — Telephone Encounter (Signed)
Pt aware labs are ordered per her request and appointment scheduled for pt to come in for labs.

## 2019-05-28 NOTE — Telephone Encounter (Signed)
Ok

## 2019-05-28 NOTE — Telephone Encounter (Signed)
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

## 2019-06-04 ENCOUNTER — Other Ambulatory Visit: Payer: Self-pay

## 2019-06-05 ENCOUNTER — Other Ambulatory Visit (INDEPENDENT_AMBULATORY_CARE_PROVIDER_SITE_OTHER): Payer: Commercial Managed Care - PPO

## 2019-06-05 DIAGNOSIS — Z111 Encounter for screening for respiratory tuberculosis: Secondary | ICD-10-CM | POA: Diagnosis not present

## 2019-06-08 LAB — QUANTIFERON-TB GOLD PLUS
Mitogen-NIL: >10 IU/mL
NIL: 0.18 IU/mL
QuantiFERON-TB Gold Plus: NEGATIVE
TB1-NIL: <0 IU/mL
TB2-NIL: <0 IU/mL

## 2019-06-10 ENCOUNTER — Encounter: Payer: Self-pay | Admitting: Nurse Practitioner

## 2020-02-28 ENCOUNTER — Encounter: Payer: Self-pay | Admitting: Family Medicine

## 2020-02-28 ENCOUNTER — Ambulatory Visit (INDEPENDENT_AMBULATORY_CARE_PROVIDER_SITE_OTHER): Payer: Commercial Managed Care - PPO | Admitting: Family Medicine

## 2020-02-28 ENCOUNTER — Other Ambulatory Visit: Payer: Self-pay

## 2020-02-28 VITALS — BP 118/70 | HR 87 | Temp 97.7°F | Ht 68.0 in | Wt 184.4 lb

## 2020-02-28 DIAGNOSIS — M549 Dorsalgia, unspecified: Secondary | ICD-10-CM | POA: Diagnosis not present

## 2020-02-28 DIAGNOSIS — M5416 Radiculopathy, lumbar region: Secondary | ICD-10-CM

## 2020-02-28 MED ORDER — PREDNISONE 20 MG PO TABS: ORAL_TABLET | ORAL | 0 refills | Status: DC

## 2020-02-28 NOTE — Progress Notes (Signed)
Krista Jackson is a 49 y.o. female  Chief Complaint  Patient presents with   Acute Visit    low back pain c/o having kidney stones x couple months with last 1-2 days worse,     HPI: Krista Jackson is a 49 y.o. female who complains of Lt back/flank pain that is "nagging" and comes/goes. She notes increased pain yesterday, improved today. She drank 2L of water yesterday into today.  She was able to sleep last night. No dysuria, urgency, frequency, gross hematuria. No fever, chills. Appetite is fine, some mild nausea yesterday.  No personal h/o kidney stones. Parents with kidney stones.   Pt does have a h/o LBP with radiation to leg and numbness in Lt foot. She had been recommended nsaids, muscle relaxant but never took. PCP also mentioned prednisone but this was never Rx'd. Pain comes/goes, more often recently. Pt works as Therapist, sports at [employer].     History reviewed. No pertinent past medical history.  History reviewed. No pertinent surgical history.  Social History   Socioeconomic History   Marital status: Married    Spouse name: Not on file   Number of children: Not on file   Years of education: Not on file   Highest education level: Not on file  Occupational History   Not on file  Tobacco Use   Smoking status: Never Smoker   Smokeless tobacco: Never Used  Vaping Use   Vaping Use: Never used  Substance and Sexual Activity   Alcohol use: Not Currently   Drug use: Never   Sexual activity: Yes    Birth control/protection: Surgical    Comment: husband had vasectomy  Other Topics Concern   Not on file  Social History Narrative   Not on file   Social Determinants of Health   Financial Resource Strain:    Difficulty of Paying Living Expenses: Not on file  Food Insecurity:    Worried About [place] in the Last Year: Not on file   [employer] of Food in the Last Year: Not on file  Transportation Needs:    Lack of Transportation (Medical): Not on file     Lack of Transportation (Non-Medical): Not on file  Physical Activity:    Days of Exercise per Week: Not on file   Minutes of Exercise per Session: Not on file  Stress:    Feeling of Stress : Not on file  Social Connections:    Frequency of Communication with Friends and Family: Not on file   Frequency of Social Gatherings with Friends and Family: Not on file   Attends Religious Services: Not on file   Active Member of Clubs or Organizations: Not on file   Attends Archivist Meetings: Not on file   Marital Status: Not on file  Intimate Partner Violence:    Fear of Current or Ex-Partner: Not on file   Emotionally Abused: Not on file   Physically Abused: Not on file   Sexually Abused: Not on file    Family History  Problem Relation Age of Onset   Hyperlipidemia Mother    Arthritis Mother    Depression Mother    Miscarriages / [place] Mother    Diabetes Father    Hyperlipidemia Father    Cancer Paternal Aunt 89       breast cancer   Hyperlipidemia Maternal Grandmother    Stroke Maternal Grandmother    Heart disease Maternal Grandmother    Heart disease Maternal Grandfather  Heart attack Maternal Grandfather    Cancer Paternal Grandmother 42       breast cancer   Heart disease Paternal Grandfather    Hyperlipidemia Paternal Grandfather    Hearing loss Paternal Grandfather    Heart attack Paternal Grandfather      Immunization History  Administered Date(s) Administered   Influenza,inj,Quad PF,6+ Mos 03/06/2018   PPD Test 01/23/2017   Tdap 01/23/2017    Outpatient Encounter Medications as of 02/28/2020  Medication Sig   Multiple Vitamin (MULTIVITAMIN) tablet Take 1 tablet by mouth daily.   Omega-3 Fatty Acids (FISH OIL) 1200 MG CAPS Take by mouth. otc   omeprazole (PRILOSEC) 20 MG capsule Take 1 capsule (20 mg total) by mouth 2 (two) times daily.   No facility-administered encounter medications on file as of  02/28/2020.     ROS: Pertinent positives and negatives noted in HPI. Remainder of ROS non-contributory  No Known Allergies  BP 118/70    Pulse 87    Temp 97.7 F (36.5 C) (Temporal)    Ht 5' 8"  (1.727 m)    Wt 184 lb 6.4 oz (83.6 kg)    SpO2 97%    BMI 28.04 kg/m   Physical Exam Constitutional:      General: She is not in acute distress.    Appearance: Normal appearance. She is not ill-appearing or diaphoretic.  Abdominal:     Tenderness: There is no right CVA tenderness or left CVA tenderness.  Musculoskeletal:     Lumbar back: No swelling, deformity, spasms, tenderness or bony tenderness. Normal range of motion.  Neurological:     General: No focal deficit present.     Mental Status: She is alert and oriented to person, place, and time.     Motor: No weakness.     Coordination: Coordination normal.     Gait: Gait normal.  Psychiatric:        Mood and Affect: Mood normal.        Behavior: Behavior normal.      A/P:  1. Lumbar back pain with radiculopathy affecting lower extremity 2. Musculoskeletal back pain - heat BID - daily exercises included in AVS, consider PT referral Rx: - predniSONE (DELTASONE) 20 MG tablet; 3 tabs po daily x 3 days, 2 tabs po daily x 2 days, 1 tab po daily x 3 days, 1/2 tab po daily x 3 days  Dispense: 20 tablet; Refill: 0 - f/u with PCP if symptoms worsen or do not improve with above Discussed plan and reviewed medications with patient, including risks, benefits, and potential side effects. Pt expressed understand. All questions answered.   This visit occurred during the SARS-CoV-2 public health emergency.  Safety protocols were in place, including screening questions prior to the visit, additional usage of staff PPE, and extensive cleaning of exam room while observing appropriate contact time as indicated for disinfecting solutions.

## 2020-02-28 NOTE — Patient Instructions (Signed)

## 2020-03-08 IMAGING — DX LUMBAR SPINE - COMPLETE 4+ VIEW
2 series · 2 of 2 positions shown · non-contrast
Comparison: None.

CLINICAL DATA: Chronic lumbago with right-sided radicular symptoms

EXAM:
LUMBAR SPINE - COMPLETE 4+ VIEW

[lumbar spine lmo]
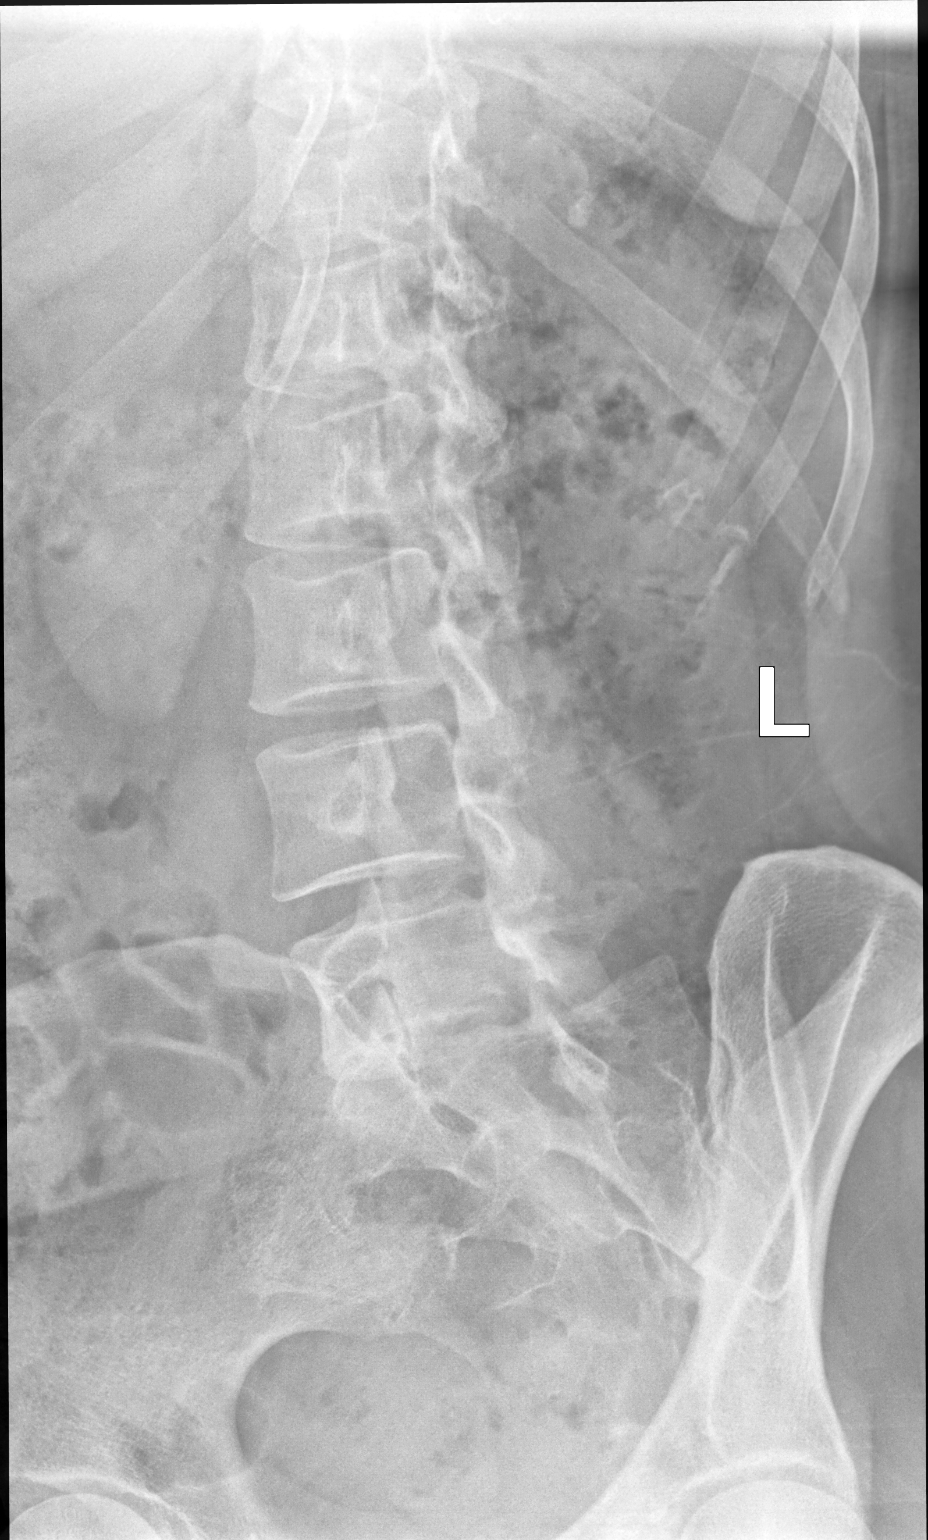

[lumbar spine lat]
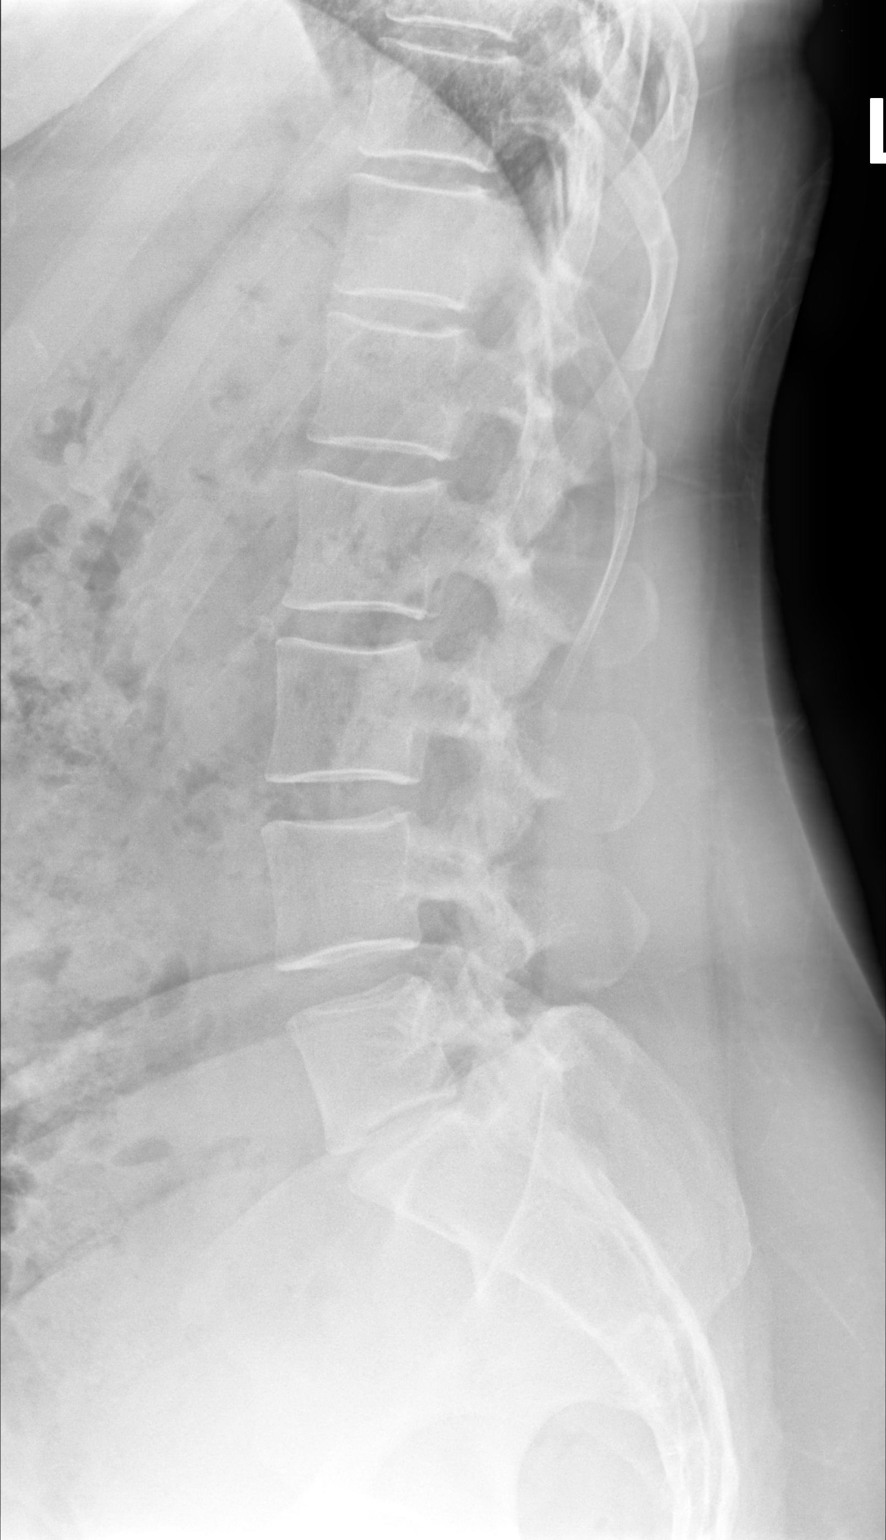

[2 of 2 positions shown; findings below may reference images not displayed]

FINDINGS: Frontal, lateral, spot lumbosacral lateral, and bilateral oblique
views were obtained. There are 5 non-rib-bearing lumbar type
vertebral bodies. No fracture or spondylolisthesis. There is
moderate disc space narrowing at L5-S1. Other disc spaces appear
unremarkable. There is facet osteoarthritic change on the right at
L5-S1. Facets elsewhere appear unremarkable.
IMPRESSION: Osteoarthritic change at L5-S1.  No fracture or spondylolisthesis.

## 2020-04-23 ENCOUNTER — Ambulatory Visit (HOSPITAL_COMMUNITY)
Admission: RE | Admit: 2020-04-23 | Discharge: 2020-04-23 | Disposition: A | Discharge status: Home / Self Care | Payer: Commercial Managed Care - PPO | Source: Ambulatory Visit | Attending: Pulmonary Disease | Admitting: Pulmonary Disease

## 2020-04-23 ENCOUNTER — Telehealth (HOSPITAL_COMMUNITY): Payer: Self-pay

## 2020-04-23 ENCOUNTER — Other Ambulatory Visit (HOSPITAL_COMMUNITY): Payer: Self-pay

## 2020-04-23 ENCOUNTER — Other Ambulatory Visit: Payer: Self-pay | Admitting: Physician Assistant

## 2020-04-23 DIAGNOSIS — U071 COVID-19: Secondary | ICD-10-CM | POA: Diagnosis not present

## 2020-04-23 DIAGNOSIS — E663 Overweight: Secondary | ICD-10-CM

## 2020-04-23 MED ORDER — METHYLPREDNISOLONE SODIUM SUCC 125 MG IJ SOLR: 125.0000 mg | Freq: Once | INTRAMUSCULAR | Status: DC | PRN

## 2020-04-23 MED ORDER — FAMOTIDINE IN NACL 20-0.9 MG/50ML-% IV SOLN: 20.0000 mg | Freq: Once | INTRAVENOUS | Status: DC | PRN

## 2020-04-23 MED ORDER — SODIUM CHLORIDE 0.9 % IV SOLN: INTRAVENOUS | Status: DC | PRN

## 2020-04-23 MED ORDER — SOTROVIMAB 500 MG/8ML IV SOLN
500.0000 mg | Freq: Once | INTRAVENOUS | Status: AC
  Administered 2020-04-23: 500 mg via INTRAVENOUS

## 2020-04-23 MED ORDER — EPINEPHRINE 0.3 MG/0.3ML IJ SOAJ: 0.3000 mg | Freq: Once | INTRAMUSCULAR | Status: DC | PRN

## 2020-04-23 MED ORDER — DIPHENHYDRAMINE HCL 50 MG/ML IJ SOLN: 50.0000 mg | Freq: Once | INTRAMUSCULAR | Status: DC | PRN

## 2020-04-23 MED ORDER — ALBUTEROL SULFATE HFA 108 (90 BASE) MCG/ACT IN AERS: 2.0000 | INHALATION_SPRAY | Freq: Once | RESPIRATORY_TRACT | Status: DC | PRN

## 2020-04-23 NOTE — Progress Notes (Signed)
  Diagnosis: COVID-19  Physician: Dr. Joya Gaskins  Procedure:  Sotrovimab administered via IV.  Patient provided with medication fact sheet prior to infusion and questions answered.  Patient provided with discharge instructions at discharge and all questions answered. Pt educated on potential cost of medication infusion. Pt states understanding and wishes to proceide with infusion.    Complications: No immediate complications noted.  Discharge: Discharged home   Krista Jackson 04/23/2020

## 2020-04-23 NOTE — Discharge Instructions (Signed)
10 Things You Can Do to Manage Your COVID-19 Symptoms at Home If you have possible or confirmed COVID-19: 1. Stay home from work and school. And stay away from other public places. If you must go out, avoid using any kind of public transportation, ridesharing, or taxis. 2. Monitor your symptoms carefully. If your symptoms get worse, call your healthcare provider immediately. 3. Get rest and stay hydrated. 4. If you have a medical appointment, call the healthcare provider ahead of time and tell them that you have or may have COVID-19. 5. For medical emergencies, call 911 and notify the dispatch personnel that you have or may have COVID-19. 6. Cover your cough and sneezes with a tissue or use the inside of your elbow. 7. Wash your hands often with soap and water for at least 20 seconds or clean your hands with an alcohol-based hand sanitizer that contains at least 60% alcohol. 8. As much as possible, stay in a specific room and away from other people in your home. Also, you should use a separate bathroom, if available. If you need to be around other people in or outside of the home, wear a mask. 9. Avoid sharing personal items with other people in your household, like dishes, towels, and bedding. 10. Clean all surfaces that are touched often, like counters, tabletops, and doorknobs. Use household cleaning sprays or wipes according to the label instructions. [email or web address] 12/05/2018 This information is not intended to replace advice given to you by your health care provider. Make sure you discuss any questions you have with your health care provider. Document Revised: 05/09/2019 Document Reviewed: 05/09/2019 Elsevier Patient Education  [place].  What types of side effects do monoclonal antibody drugs cause?  Common side effects  In general, the more common side effects caused by monoclonal antibody drugs include: . Allergic reactions, such as hives or itching . Flu-like signs and  symptoms, including chills, fatigue, fever, and muscle aches and pains . Nausea, vomiting . Diarrhea . Skin rashes . Low blood pressure   The CDC is recommending patients who receive monoclonal antibody treatments wait at least 90 days before being vaccinated.  Currently, there are no data on the safety and efficacy of mRNA COVID-19 vaccines in persons who received monoclonal antibodies or convalescent plasma as part of COVID-19 treatment. Based on the estimated half-life of such therapies as well as evidence suggesting that reinfection is uncommon in the 90 days after initial infection, vaccination should be deferred for at least 90 days, as a precautionary measure until additional information becomes available, to avoid interference of the antibody treatment with vaccine-induced immune responses.  If after the infusion you have any questions or concerns please call the Advanced Practice Provider at [phone]

## 2020-04-23 NOTE — Telephone Encounter (Signed)
Called to Discuss with patient about Covid symptoms and the use of the monoclonal antibody infusion for those with mild to moderate Covid symptoms and at a high risk of hospitalization.     Pt appears to qualify for this infusion due to co-morbid conditions and/or a member of an at-risk group in accordance with the FDA Emergency Use Authorization.    Risk factors include BMI >25. Sx onset 04/22/20. Positive COVID 19 test 04/22/20 through [facility].  Discussed costs of treatment with pt, provided both CPT & REV code, encouraged to call health insurance company to verify how much pt would be financially responsible for.  Pre-screened by RN and ready for APP to call and further discuss/schedule appt time.

## 2020-04-23 NOTE — Progress Notes (Signed)
I connected by phone with Krista Jackson on 04/23/2020 at 1:30 PM to discuss the potential use of a new treatment for mild to moderate COVID-19 viral infection in non-hospitalized patients.  This patient is a 49 y.o. female that meets the FDA criteria for Emergency Use Authorization of COVID monoclonal antibody casirivimab/imdevimab, bamlanivimab/eteseviamb, or sotrovimab.  Has a (+) direct SARS-CoV-2 viral test result  Has mild or moderate COVID-19   Is NOT hospitalized due to COVID-19  Is within 10 days of symptom onset  Has at least one of the high risk factor(s) for progression to severe COVID-19 and/or hospitalization as defined in EUA.  Specific high risk criteria : BMI > 25 . Works at health care setting   I have spoken and communicated the following to the patient or parent/caregiver regarding COVID monoclonal antibody treatment:  1. FDA has authorized the emergency use for the treatment of mild to moderate COVID-19 in adults and pediatric patients with positive results of direct SARS-CoV-2 viral testing who are 76 years of age and older weighing at least 40 kg, and who are at high risk for progressing to severe COVID-19 and/or hospitalization.  2. The significant known and potential risks and benefits of COVID monoclonal antibody, and the extent to which such potential risks and benefits are unknown.  3. Information on available alternative treatments and the risks and benefits of those alternatives, including clinical trials.  4. Patients treated with COVID monoclonal antibody should continue to self-isolate and use infection control measures (e.g., wear mask, isolate, social distance, avoid sharing personal items, clean and disinfect "high touch" surfaces, and frequent handwashing) according to CDC guidelines.   5. The patient or parent/caregiver has the option to accept or refuse COVID monoclonal antibody treatment.  After reviewing this information with the patient, the  patient has agreed to receive one of the available covid 19 monoclonal antibodies and will be provided an appropriate fact sheet prior to infusion.    [place], [place] 04/23/2020 1:30 PM

## 2020-11-24 ENCOUNTER — Other Ambulatory Visit: Payer: Self-pay

## 2020-11-24 ENCOUNTER — Encounter: Payer: Self-pay | Admitting: Nurse Practitioner

## 2020-11-24 ENCOUNTER — Ambulatory Visit (INDEPENDENT_AMBULATORY_CARE_PROVIDER_SITE_OTHER): Payer: No Typology Code available for payment source | Admitting: Nurse Practitioner

## 2020-11-24 VITALS — BP 102/64 | HR 69 | Temp 97.7°F | Ht 68.0 in | Wt 185.4 lb

## 2020-11-24 DIAGNOSIS — R7301 Impaired fasting glucose: Secondary | ICD-10-CM

## 2020-11-24 DIAGNOSIS — E782 Mixed hyperlipidemia: Secondary | ICD-10-CM | POA: Diagnosis not present

## 2020-11-24 DIAGNOSIS — Z1231 Encounter for screening mammogram for malignant neoplasm of breast: Secondary | ICD-10-CM

## 2020-11-24 DIAGNOSIS — Z0000 Encounter for general adult medical examination without abnormal findings: Secondary | ICD-10-CM

## 2020-11-24 DIAGNOSIS — Z1211 Encounter for screening for malignant neoplasm of colon: Secondary | ICD-10-CM

## 2020-11-24 LAB — LIPID PANEL
Cholesterol: 212 mg/dL — ABNORMAL HIGH (ref 0–200)
HDL: 48.3 mg/dL (ref >39.00)
LDL Cholesterol: 143 mg/dL — ABNORMAL HIGH (ref 0–99)
NonHDL: 163.7
Total CHOL/HDL Ratio: 4
Triglycerides: 102 mg/dL (ref 0.0–149.0)
VLDL: 20.4 mg/dL (ref 0.0–40.0)

## 2020-11-24 LAB — CBC
HCT: 38.1 % (ref 36.0–46.0)
Hemoglobin: 13 g/dL (ref 12.0–15.0)
MCHC: 34.1 g/dL (ref 30.0–36.0)
MCV: 81.5 fl (ref 78.0–100.0)
Platelets: 210 10*3/uL (ref 150.0–400.0)
RBC: 4.68 Mil/uL (ref 3.87–5.11)
RDW: 14.4 % (ref 11.5–15.5)
WBC: 8.2 10*3/uL (ref 4.0–10.5)

## 2020-11-24 LAB — TSH: TSH: 3.81 u[IU]/mL (ref 0.35–4.50)

## 2020-11-24 LAB — HEMOGLOBIN A1C: Hgb A1c MFr Bld: 5.9 % (ref 4.6–6.5)

## 2020-11-24 NOTE — Progress Notes (Signed)
Subjective:    Patient ID: Krista Jackson, female    DOB: 1971-01-17, 50 y.o.   MRN: [id number]  Patient presents today for CPE   HPI  Sexual History (orientation,birth control, marital status, STD):declined pelvic exam due to ongoing menstrual cycle, needs breast exam and mammogram. Agreed to cologuard order  Depression/Suicide: Depression screen [facility] 2/9 11/24/2020 02/28/2020 07/13/2018  Decreased Interest 0 0 0  Down, Depressed, Hopeless 0 0 0  PHQ - 2 Score 0 0 0  Altered sleeping 0 - -  Tired, decreased energy 0 - -  Change in appetite 0 - -  Feeling bad or failure about yourself  0 - -  Trouble concentrating 0 - -  Moving slowly or fidgety/restless 0 - -  Suicidal thoughts 0 - -  PHQ-9 Score 0 - -  Difficult doing work/chores Not difficult at all - -   Vision:up to date  Dental:up to date  Immunizations: (TDAP, Hep C screen, Pneumovax, Influenza, zoster)  Health Maintenance  Topic Date Due   Colon Cancer Screening  Never done   Pap Smear  11/24/2020*   Hepatitis C Screening: USPSTF Recommendation to screen - Ages 18-79 yo.  11/24/2021*   HIV Screening  11/24/2021*   Flu Shot  01/04/2021   Tetanus Vaccine  01/24/2027   Pneumococcal Vaccination  Aged Out   HPV Vaccine  Aged Out  *Topic was postponed. The date shown is not the original due date.   Diet:regular Exercise: walking Weight:  Wt Readings from Last 3 Encounters:  11/24/20 185 lb 6.4 oz (84.1 kg)  02/28/20 184 lb 6.4 oz (83.6 kg)  01/11/19 178 lb (80.7 kg)    Fall Risk: Fall Risk  11/24/2020 02/28/2020 07/13/2018  Falls in the past year? 0 0 0  Number falls in past yr: 0 0 -  Injury with Fall? 0 0 -   Medications and allergies reviewed with patient and updated if appropriate.  Patient Active Problem List   Diagnosis Date Noted   Chronic bilateral low back pain with bilateral sciatica 07/13/2018   IFG (impaired fasting glucose) 11/06/2017   Hyperlipidemia, unspecified 01/17/2017    Current  Outpatient Medications on File Prior to Visit  Medication Sig Dispense Refill   Multiple Vitamin (MULTIVITAMIN) tablet Take 1 tablet by mouth daily.     Omega-3 Fatty Acids (FISH OIL) 1200 MG CAPS Take by mouth. otc     omeprazole (PRILOSEC) 20 MG capsule Take 1 capsule (20 mg total) by mouth 2 (two) times daily. 60 capsule 1   predniSONE (DELTASONE) 20 MG tablet 3 tabs po daily x 3 days, 2 tabs po daily x 2 days, 1 tab po daily x 3 days, 1/2 tab po daily x 3 days 20 tablet 0   No current facility-administered medications on file prior to visit.    History reviewed. No pertinent past medical history.  History reviewed. No pertinent surgical history.  Social History   Socioeconomic History   Marital status: Married    Spouse name: Not on file   Number of children: Not on file   Years of education: Not on file   Highest education level: Not on file  Occupational History   Not on file  Tobacco Use   Smoking status: Never   Smokeless tobacco: Never  Vaping Use   Vaping Use: Never used  Substance and Sexual Activity   Alcohol use: Not Currently   Drug use: Never   Sexual activity: Yes    Birth  control/protection: Surgical    Comment: husband had vasectomy  Other Topics Concern   Not on file  Social History Narrative   Not on file   Social Determinants of Health   Financial Resource Strain: Not on file  Food Insecurity: Not on file  Transportation Needs: Not on file  Physical Activity: Not on file  Stress: Not on file  Social Connections: Not on file    Family History  Problem Relation Age of Onset   Hyperlipidemia Mother    Arthritis Mother    Depression Mother    Miscarriages / [place] Mother    Diabetes Father    Hyperlipidemia Father    Cancer Paternal Aunt 65       breast cancer   Hyperlipidemia Maternal Grandmother    Stroke Maternal Grandmother    Heart disease Maternal Grandmother    Heart disease Maternal Grandfather    Heart attack Maternal  Grandfather    Cancer Paternal Grandmother 36       breast cancer   Heart disease Paternal Grandfather    Hyperlipidemia Paternal Grandfather    Hearing loss Paternal Grandfather    Heart attack Paternal Grandfather         Review of Systems  Constitutional:  Negative for fever, malaise/fatigue and weight loss.  HENT:  Negative for congestion and sore throat.   Eyes:        Negative for visual changes  Respiratory:  Negative for cough and shortness of breath.   Cardiovascular:  Negative for chest pain, palpitations and leg swelling.  Gastrointestinal:  Negative for blood in stool, constipation, diarrhea and heartburn.  Genitourinary:  Negative for dysuria, frequency and urgency.  Musculoskeletal:  Negative for falls, joint pain and myalgias.  Skin:  Negative for rash.  Neurological:  Negative for dizziness, sensory change and headaches.  Endo/Heme/Allergies:  Does not bruise/bleed easily.  Psychiatric/Behavioral:  Negative for depression, substance abuse and suicidal ideas. The patient is not nervous/anxious.    Objective:   Vitals:   11/24/20 0823  BP: 102/64  Pulse: 69  Temp: 97.7 F (36.5 C)  SpO2: (!) 9%    Body mass index is 28.19 kg/m.   Physical Examination:  Physical Exam Vitals reviewed.  Constitutional:      General: She is not in acute distress. HENT:     Right Ear: Tympanic membrane, ear canal and external ear normal.     Left Ear: Tympanic membrane, ear canal and external ear normal.     Nose: Nose normal.     Mouth/Throat:     Pharynx: No oropharyngeal exudate.  Eyes:     General: No scleral icterus. Cardiovascular:     Rate and Rhythm: Normal rate and regular rhythm.     Heart sounds: Normal heart sounds.  Pulmonary:     Effort: Pulmonary effort is normal. No respiratory distress.     Breath sounds: Normal breath sounds.  Chest:  Breasts:    Right: Normal. No axillary adenopathy or supraclavicular adenopathy.     Left: Normal. No axillary  adenopathy or supraclavicular adenopathy.  Abdominal:     General: There is no distension.     Palpations: Abdomen is soft.  Genitourinary:    Comments: declined Musculoskeletal:        General: Normal range of motion.     Cervical back: Normal range of motion and neck supple.  Lymphadenopathy:     Cervical: No cervical adenopathy.     Upper Body:  Right upper body: No supraclavicular, axillary or pectoral adenopathy.     Left upper body: No supraclavicular, axillary or pectoral adenopathy.  Skin:    General: Skin is warm and dry.  Neurological:     Mental Status: She is alert and oriented to person, place, and time.  Psychiatric:        Behavior: Behavior normal.   ASSESSMENT and PLAN: This visit occurred during the SARS-CoV-2 public health emergency.  Safety protocols were in place, including screening questions prior to the visit, additional usage of staff PPE, and extensive cleaning of exam room while observing appropriate contact time as indicated for disinfecting solutions.   Henreitta was seen today for annual exam.  Diagnoses and all orders for this visit:  Preventative health care -     TSH -     CBC -     Comprehensive metabolic panel; Future -     Cologuard; Future -     MM 3D SCREEN BREAST BILATERAL; Future  IFG (impaired fasting glucose) -     Hemoglobin A1c  Mixed hyperlipidemia -     Lipid panel  Colon cancer screening -     Cologuard; Future  Encounter for screening mammogram for malignant neoplasm of breast -     MM 3D SCREEN BREAST BILATERAL; Future     Problem List Items Addressed This Visit       Endocrine   IFG (impaired fasting glucose)   Relevant Orders   Hemoglobin A1c     Other   Hyperlipidemia, unspecified   Relevant Orders   Lipid panel   Other Visit Diagnoses     Preventative health care    -  Primary   Relevant Orders   TSH   CBC   Comprehensive metabolic panel   Cologuard   MM 3D SCREEN BREAST BILATERAL   Colon  cancer screening       Relevant Orders   Cologuard   Encounter for screening mammogram for malignant neoplasm of breast       Relevant Orders   MM 3D SCREEN BREAST BILATERAL       Follow up: Return in about 1 year (around 11/24/2021) for CPE (fasting).  Wilfred Lacy, NP

## 2020-11-24 NOTE — Patient Instructions (Signed)
You will be contacted to schedule mammogram  Go to lab for blood draw  Preventive Care 48-48 Years Old, Female Preventive care refers to lifestyle choices and visits with your health care provider that can promote health and wellness. This includes: A yearly physical exam. This is also called an annual wellness visit. Regular dental and eye exams. Immunizations. Screening for certain conditions. Healthy lifestyle choices, such as: Eating a healthy diet. Getting regular exercise. Not using drugs or products that contain nicotine and tobacco. Limiting alcohol use. What can I expect for my preventive care visit? Physical exam Your health care provider will check your: Height and weight. These may be used to calculate your BMI (body mass index). BMI is a measurement that tells if you are at a healthy weight. Heart rate and blood pressure. Body temperature. Skin for abnormal spots. Counseling Your health care provider may ask you questions about your: Past medical problems. Family's medical history. Alcohol, tobacco, and drug use. Emotional well-being. Home life and relationship well-being. Sexual activity. Diet, exercise, and sleep habits. Work and work Statistician. Access to firearms. Method of birth control. Menstrual cycle. Pregnancy history. What immunizations do I need?  Vaccines are usually given at various ages, according to a schedule. Your health care provider will recommend vaccines for you based on your age, medicalhistory, and lifestyle or other factors, such as travel or where you work. What tests do I need? Blood tests Lipid and cholesterol levels. These may be checked every 5 years, or more often if you are over 65 years old. Hepatitis C test. Hepatitis B test. Screening Lung cancer screening. You may have this screening every year starting at age 48 if you have a 30-pack-year history of smoking and currently smoke or have quit within the past 15  years. Colorectal cancer screening. All adults should have this screening starting at age 39 and continuing until age 17. Your health care provider may recommend screening at age 95 if you are at increased risk. You will have tests every 1-10 years, depending on your results and the type of screening test. Diabetes screening. This is done by checking your blood sugar (glucose) after you have not eaten for a while (fasting). You may have this done every 1-3 years. Mammogram. This may be done every 1-2 years. Talk with your health care provider about when you should start having regular mammograms. This may depend on whether you have a family history of breast cancer. BRCA-related cancer screening. This may be done if you have a family history of breast, ovarian, tubal, or peritoneal cancers. Pelvic exam and Pap test. This may be done every 3 years starting at age 43. Starting at age 10, this may be done every 5 years if you have a Pap test in combination with an HPV test. Other tests STD (sexually transmitted disease) testing, if you are at risk. Bone density scan. This is done to screen for osteoporosis. You may have this scan if you are at high risk for osteoporosis. Talk with your health care provider about your test results, treatment options,and if necessary, the need for more tests. Follow these instructions at home: Eating and drinking  Eat a diet that includes fresh fruits and vegetables, whole grains, lean protein, and low-fat dairy products. Take vitamin and mineral supplements as recommended by your health care provider. Do not drink alcohol if: Your health care provider tells you not to drink. You are pregnant, may be pregnant, or are planning to become pregnant. If  you drink alcohol: Limit how much you have to 0-1 drink a day. Be aware of how much alcohol is in your drink. In the [place] one drink equals one 12 oz bottle of beer (355 mL), one 5 oz glass of wine (148 mL), or one  1 oz glass of hard liquor (44 mL).  Lifestyle Take daily care of your teeth and gums. Brush your teeth every morning and night with fluoride toothpaste. Floss one time each day. Stay active. Exercise for at least 30 minutes 5 or more days each week. Do not use any products that contain nicotine or tobacco, such as cigarettes, e-cigarettes, and chewing tobacco. If you need help quitting, ask your health care provider. Do not use drugs. If you are sexually active, practice safe sex. Use a condom or other form of protection to prevent STIs (sexually transmitted infections). If you do not wish to become pregnant, use a form of birth control. If you plan to become pregnant, see your health care provider for a prepregnancy visit. If told by your health care provider, take low-dose aspirin daily starting at age 48. Find healthy ways to cope with stress, such as: Meditation, yoga, or listening to music. Journaling. Talking to a trusted person. Spending time with friends and family. Safety Always wear your seat belt while driving or riding in a vehicle. Do not drive: If you have been drinking alcohol. Do not ride with someone who has been drinking. When you are tired or distracted. While texting. Wear a helmet and other protective equipment during sports activities. If you have firearms in your house, make sure you follow all gun safety procedures. What's next? Visit your health care provider once a year for an annual wellness visit. Ask your health care provider how often you should have your eyes and teeth checked. Stay up to date on all vaccines. This information is not intended to replace advice given to you by your health care provider. Make sure you discuss any questions you have with your healthcare provider. Document Revised: 02/25/2020 Document Reviewed: 02/01/2018 Elsevier Patient Education  2022 Elsevier Inc.  

## 2021-07-21 ENCOUNTER — Encounter: Payer: Self-pay | Admitting: Family Medicine

## 2021-07-21 ENCOUNTER — Other Ambulatory Visit: Payer: Self-pay

## 2021-07-21 ENCOUNTER — Ambulatory Visit (INDEPENDENT_AMBULATORY_CARE_PROVIDER_SITE_OTHER): Payer: No Typology Code available for payment source | Admitting: Family Medicine

## 2021-07-21 VITALS — BP 110/68 | HR 84 | Temp 97.6°F | Wt 181.2 lb

## 2021-07-21 DIAGNOSIS — L819 Disorder of pigmentation, unspecified: Secondary | ICD-10-CM

## 2021-07-21 DIAGNOSIS — Z85828 Personal history of other malignant neoplasm of skin: Secondary | ICD-10-CM | POA: Insufficient documentation

## 2021-07-21 NOTE — Progress Notes (Signed)
Established Patient Office Visit  Subjective:  Patient ID: Krista Jackson, female    DOB: Jun 21, 1970  Age: 51 y.o. MRN: [id number]  CC:  Chief Complaint  Patient presents with   Referral    Concerns about spot on back of neck x few weeks would like referral to Dermatologist.     HPI Krista Jackson presents for evaluation of a lesion on her right posterior nape.  She is uncertain as to how long it has been there.  Her husband noticed it a few weeks ago.  It is covered with her usual hairstyle.  She had put her hair up and upon when he noticed it.  She knows of no other changing lesions around her body.  Positive family history of basal cell carcinomas.  She is healthy and feels well.  History reviewed. No pertinent past medical history.  History reviewed. No pertinent surgical history.  Family History  Problem Relation Age of Onset   Hyperlipidemia Mother    Arthritis Mother    Depression Mother    Miscarriages / [place] Mother    Diabetes Father    Hyperlipidemia Father    Cancer Paternal Aunt 47       breast cancer   Hyperlipidemia Maternal Grandmother    Stroke Maternal Grandmother    Heart disease Maternal Grandmother    Heart disease Maternal Grandfather    Heart attack Maternal Grandfather    Cancer Paternal Grandmother 31       breast cancer   Heart disease Paternal Grandfather    Hyperlipidemia Paternal Grandfather    Hearing loss Paternal Grandfather    Heart attack Paternal Grandfather     Social History   Socioeconomic History   Marital status: Married    Spouse name: Not on file   Number of children: Not on file   Years of education: Not on file   Highest education level: Not on file  Occupational History   Not on file  Tobacco Use   Smoking status: Never   Smokeless tobacco: Never  Vaping Use   Vaping Use: Never used  Substance and Sexual Activity   Alcohol use: Not Currently   Drug use: Never   Sexual activity: Yes    Birth  control/protection: Surgical    Comment: husband had vasectomy  Other Topics Concern   Not on file  Social History Narrative   Not on file   Social Determinants of Health   Financial Resource Strain: Not on file  Food Insecurity: Not on file  Transportation Needs: Not on file  Physical Activity: Not on file  Stress: Not on file  Social Connections: Not on file  Intimate Partner Violence: Not on file    Outpatient Medications Prior to Visit  Medication Sig Dispense Refill   Multiple Vitamin (MULTIVITAMIN) tablet Take 1 tablet by mouth daily.     Omega-3 Fatty Acids (FISH OIL) 1200 MG CAPS Take by mouth. otc     omeprazole (PRILOSEC) 20 MG capsule Take 1 capsule (20 mg total) by mouth 2 (two) times daily. (Patient not taking: Reported on 07/21/2021) 60 capsule 1   No facility-administered medications prior to visit.    No Known Allergies  ROS Review of Systems  Constitutional:  Negative for chills, diaphoresis, fatigue, fever and unexpected weight change.  Respiratory: Negative.    Cardiovascular: Negative.   Gastrointestinal: Negative.   Skin:  Positive for color change. Negative for pallor, rash and wound.  Psychiatric/Behavioral: Negative.  Objective:    Physical Exam Vitals and nursing note reviewed.  Constitutional:      Appearance: Normal appearance.  HENT:     Head: Normocephalic and atraumatic.  Eyes:     General: No scleral icterus.       Right eye: No discharge.        Left eye: No discharge.     Extraocular Movements: Extraocular movements intact.     Conjunctiva/sclera: Conjunctivae normal.  Pulmonary:     Effort: Pulmonary effort is normal.  Skin:    General: Skin is warm and dry.       Neurological:     Mental Status: She is alert and oriented to person, place, and time.  Psychiatric:        Mood and Affect: Mood normal.        Behavior: Behavior normal.    BP 110/68 (BP Location: Right Arm, Patient Position: Sitting, Cuff Size: Large)     Pulse 84    Temp 97.6 F (36.4 C) (Temporal)    Wt 181 lb 3.2 oz (82.2 kg)    SpO2 97%    BMI 27.55 kg/m  Wt Readings from Last 3 Encounters:  07/21/21 181 lb 3.2 oz (82.2 kg)  11/24/20 185 lb 6.4 oz (84.1 kg)  02/28/20 184 lb 6.4 oz (83.6 kg)     Health Maintenance Due  Topic Date Due   COLONOSCOPY (Pts 45-72yr Insurance coverage will need to be confirmed)  Never done   PAP SMEAR-Modifier  11/06/2020    There are no preventive care reminders to display for this patient.  Lab Results  Component Value Date   TSH 3.81 11/24/2020   Lab Results  Component Value Date   WBC 8.2 11/24/2020   HGB 13.0 11/24/2020   HCT 38.1 11/24/2020   MCV 81.5 11/24/2020   PLT 210.0 11/24/2020   Lab Results  Component Value Date   NA 137 01/11/2019   K 5.1 01/11/2019   CO2 27 01/11/2019   GLUCOSE 104 (H) 01/11/2019   BUN 11 01/11/2019   CREATININE 0.78 01/11/2019   BILITOT 0.5 01/11/2019   ALKPHOS 62 01/11/2019   AST 20 01/11/2019   ALT 19 01/11/2019   PROT 6.7 01/11/2019   ALBUMIN 4.2 01/11/2019   CALCIUM 9.0 01/11/2019   ANIONGAP 7 10/06/2017   GFR 79.55 01/11/2019   Lab Results  Component Value Date   CHOL 212 (H) 11/24/2020   Lab Results  Component Value Date   HDL 48.30 11/24/2020   Lab Results  Component Value Date   LDLCALC 143 (H) 11/24/2020   Lab Results  Component Value Date   TRIG 102.0 11/24/2020   Lab Results  Component Value Date   CHOLHDL 4 11/24/2020   Lab Results  Component Value Date   HGBA1C 5.9 11/24/2020      Assessment & Plan:   Problem List Items Addressed This Visit       Musculoskeletal and Integument   Atypical pigmented skin lesion - Primary   Relevant Orders   Ambulatory referral to Dermatology    No orders of the defined types were placed in this encounter.   Follow-up: No follow-ups on file.    WLibby Maw MD

## 2021-07-28 ENCOUNTER — Encounter: Payer: Self-pay | Admitting: Dermatology

## 2021-07-28 ENCOUNTER — Ambulatory Visit: Payer: No Typology Code available for payment source | Admitting: Dermatology

## 2021-07-28 ENCOUNTER — Other Ambulatory Visit: Payer: Self-pay

## 2021-07-28 DIAGNOSIS — D492 Neoplasm of unspecified behavior of bone, soft tissue, and skin: Secondary | ICD-10-CM

## 2021-07-28 DIAGNOSIS — L814 Other melanin hyperpigmentation: Secondary | ICD-10-CM | POA: Diagnosis not present

## 2021-07-28 DIAGNOSIS — C4491 Basal cell carcinoma of skin, unspecified: Secondary | ICD-10-CM

## 2021-07-28 DIAGNOSIS — C4441 Basal cell carcinoma of skin of scalp and neck: Secondary | ICD-10-CM | POA: Diagnosis not present

## 2021-07-28 HISTORY — DX: Basal cell carcinoma of skin, unspecified: C44.91

## 2021-07-28 NOTE — Patient Instructions (Signed)
Wound Care Instructions  Cleanse wound gently with soap and water once a day then pat dry with clean gauze. Apply a thing coat of Petrolatum (petroleum jelly, "Vaseline") over the wound (unless you have an allergy to this). We recommend that you use a new, sterile tube of Vaseline. Do not pick or remove scabs. Do not remove the yellow or white "healing tissue" from the base of the wound.  Cover the wound with fresh, clean, nonstick gauze and secure with paper tape. You may use Band-Aids in place of gauze and tape if the would is small enough, but would recommend trimming much of the tape off as there is often too much. Sometimes Band-Aids can irritate the skin.  You should call the office for your biopsy report after 1 week if you have not already been contacted.  If you experience any problems, such as abnormal amounts of bleeding, swelling, significant bruising, significant pain, or evidence of infection, please call the office immediately.  FOR ADULT SURGERY PATIENTS: If you need something for pain relief you may take 1 extra strength Tylenol (acetaminophen) AND 2 Ibuprofen (250m each) together every 4 hours as needed for pain. (do not take these if you are allergic to them or if you have a reason you should not take them.) Typically, you may only need pain medication for 1 to 3 days.    If You Need Anything After Your Visit  If you have any questions or concerns for your doctor, please call our main line at [phone] press option 4 to reach your doctor's medical assistant. If no one answers, please leave a voicemail as directed and we will return your call as soon as possible. Messages left after 4 pm will be answered the following business day.   You may also send [place] message via [place] We typically respond to MyChart messages within 1-2 business days.  For prescription refills, please ask your pharmacy to contact our office. Our fax number is [phone]  If you have an urgent  issue when the clinic is closed that cannot wait until the next business day, you can page your doctor at the number below.    Please note that while we do our best to be available for urgent issues outside of office hours, we are not available 24/7.   If you have an urgent issue and are unable to reach [place] you may choose to seek medical care at your doctor's office, retail clinic, urgent care center, or emergency room.  If you have a medical emergency, please immediately call 911 or go to the emergency department.  Pager Numbers  - Dr. KNehemiah Massed [phone] - Dr. MLaurence Ferrari [phone] - Dr. SNicole Kindred [phone] In the event of inclement weather, please call our main line at [phone] an update on the status of any delays or closures.  Dermatology Medication Tips: Please keep the boxes that topical medications come in in order to help keep track of the instructions about where and how to use these. Pharmacies typically print the medication instructions only on the boxes and not directly on the medication tubes.   If your medication is too expensive, please contact our office at [phone] 4 or send [place] message through [place]   We are unable to tell what your co-pay for medications will be in advance as this is different depending on your insurance coverage. However, we may be able to find a substitute medication at lower cost or fill  out paperwork to get insurance to cover a needed medication.   If a prior authorization is required to get your medication covered by your insurance company, please allow [place] 1-2 business days to complete this process.  Drug prices often vary depending on where the prescription is filled and some pharmacies may offer cheaper prices.  The website www.goodrx.com contains coupons for medications through different pharmacies. The prices here do not account for what the cost may be with help from insurance (it may be cheaper with your  insurance), but the website can give you the price if you did not use any insurance.  - You can print the associated coupon and take it with your prescription to the pharmacy.  - You may also stop by our office during regular business hours and pick up a GoodRx coupon card.  - If you need your prescription sent electronically to a different pharmacy, notify our office through [facility] or by phone at [phone] option 4.     Si Usted Necesita Algo Despus de Su Visita  Tambin puede enviarnos un mensaje a travs de Pharmacist, community. Por lo general respondemos a los mensajes de MyChart en el transcurso de 1 a 2 das hbiles.  Para renovar recetas, por favor pida a su farmacia que se ponga en contacto con nuestra oficina. Harland Dingwall de fax es [place] [phone].  Si tiene un asunto urgente cuando la clnica est cerrada y que no puede esperar hasta el siguiente da hbil, puede llamar/localizar a su doctor(a) al nmero que aparece a continuacin.   Por favor, tenga en cuenta que aunque hacemos todo lo posible para estar disponibles para asuntos urgentes fuera del horario de [place], no estamos disponibles las 24 horas del da, los 7 das de la [place].   Si tiene un problema urgente y no puede comunicarse con nosotros, puede optar por buscar atencin mdica  en el consultorio de su doctor(a), en una clnica privada, en un centro de atencin urgente o en una sala de emergencias.  Si tiene Engineering geologist, por favor llame inmediatamente al 911 o vaya a la sala de emergencias.  Nmeros de bper  - Dr. Nehemiah Massed: [phone]  - Dra. Moye: [phone]  - Dra. Nicole Kindred: [phone]  En caso de inclemencias del [place], por favor llame a Johnsie Kindred principal al [phone] para una actualizacin sobre el [place] de cualquier retraso o cierre.  Consejos para la medicacin en dermatologa: Por favor, guarde las cajas en las que vienen los medicamentos de uso tpico para ayudarle a  seguir las instrucciones sobre dnde y cmo usarlos. Las farmacias generalmente imprimen las instrucciones del medicamento slo en las cajas y no directamente en los tubos del [place].   Si su medicamento es muy caro, por favor, pngase en contacto con Zigmund Daniel llamando al [phone] y presione la opcin 4 o envenos un mensaje a travs de Pharmacist, community.   No podemos decirle cul ser su copago por los medicamentos por adelantado ya que esto es diferente dependiendo de la cobertura de su seguro. Sin embargo, es posible que podamos encontrar un medicamento sustituto a Electrical engineer un formulario para que el seguro cubra el medicamento que se considera necesario.   Si se requiere una autorizacin previa para que su compaa de seguros [place] su medicamento, por favor permtanos de 1 a 2 das hbiles para completar este proceso.  Los precios de los medicamentos varan con frecuencia dependiendo del Environmental consultant de dnde se surte la receta y  alguna farmacias pueden ofrecer precios ms baratos.  El sitio web www.goodrx.com tiene cupones para medicamentos de Airline pilot. Los precios aqu no tienen en cuenta lo que podra costar con la ayuda del seguro (puede ser ms barato con su seguro), pero el sitio web puede darle el precio si no utiliz Research scientist (physical sciences).  - Puede imprimir el cupn correspondiente y llevarlo con su receta a la farmacia.  - Tambin puede pasar por nuestra oficina durante el horario de atencin regular y Charity fundraiser una tarjeta de cupones de GoodRx.  - Si necesita que su receta se enve electrnicamente a una farmacia diferente, informe a nuestra oficina a travs de MyChart de [place] o por telfono llamando al [phone] y presione la opcin 4.

## 2021-07-28 NOTE — Progress Notes (Signed)
° °  New Patient Visit  Subjective  Krista Jackson is a 51 y.o. female who presents for the following: lesion (Right posterior neck. Dur: husband noticed 2-3 weeks ago. Itches at times).  The patient has spots, moles and lesions to be evaluated, some may be new or changing and the patient has concerns that these could be cancer.   Objective   Review of Systems: No other skin or systemic complaints except as noted in HPI or Assessment and Plan.  Well appearing patient in no apparent distress; mood and affect are within normal limits.  A focused examination was performed including face, neck. Relevant physical exam findings are noted in the Assessment and Plan.  Right Posterior Neck 0.9 x 0.6 cm pink pearly papule       Assessment & Plan  Neoplasm of skin Right Posterior Neck  Skin / nail biopsy Type of biopsy: tangential   Informed consent: discussed and consent obtained   Anesthesia: the lesion was anesthetized in a standard fashion   Anesthesia comment:  Area prepped with alcohol Anesthetic:  1% lidocaine w/ epinephrine 1-100,000 buffered w/ 8.4% NaHCO3 Instrument used: flexible razor blade   Hemostasis achieved with: pressure, aluminum chloride and electrodesiccation   Outcome: patient tolerated procedure well   Post-procedure details: wound care instructions given   Post-procedure details comment:  Ointment and small bandage applied  Specimen 1 - Surgical pathology Differential Diagnosis: inflammatory papule, R/O BCC  Check Margins: No  Lentigines - Scattered tan macules - Due to sun exposure - Benign-appering, observe - Recommend daily broad spectrum sunscreen SPF 30+ to sun-exposed areas, reapply every 2 hours as needed. - Call for any changes   Return for TBSE, Next Available.  I, Emelia Salisbury, CMA, am acting as scribe for Brendolyn Patty, MD.  Documentation: I have reviewed the above documentation for accuracy and completeness, and I agree with the  above.  Brendolyn Patty MD

## 2021-08-02 ENCOUNTER — Telehealth: Payer: Self-pay

## 2021-08-02 NOTE — Telephone Encounter (Signed)
-----   Message from Brendolyn Patty, MD sent at 08/02/2021 11:57 AM EST ----- Skin , right posterior neck BASAL CELL CARCINOMA, NODULAR PATTERN, CLOSE TO MARGIN   BCC skin cancer, needs EDC  - please call patient

## 2021-08-02 NOTE — Telephone Encounter (Signed)
Left pt msg to call for bx results/sh

## 2021-08-03 ENCOUNTER — Telehealth: Payer: Self-pay

## 2021-08-03 NOTE — Telephone Encounter (Signed)
-----   Message from Brendolyn Patty, MD sent at 08/02/2021 11:57 AM EST ----- Skin , right posterior neck BASAL CELL CARCINOMA, NODULAR PATTERN, CLOSE TO MARGIN   BCC skin cancer, needs EDC  - please call patient

## 2021-08-03 NOTE — Telephone Encounter (Signed)
Patient advised of BX results and scheduled for EDC.

## 2021-08-03 NOTE — Telephone Encounter (Signed)
Left pt msg to call for bx result/sh

## 2021-09-06 ENCOUNTER — Ambulatory Visit (INDEPENDENT_AMBULATORY_CARE_PROVIDER_SITE_OTHER): Payer: No Typology Code available for payment source | Admitting: Dermatology

## 2021-09-06 DIAGNOSIS — C4441 Basal cell carcinoma of skin of scalp and neck: Secondary | ICD-10-CM

## 2021-09-06 NOTE — Patient Instructions (Signed)
Wound Care Instructions ? ?Cleanse wound gently with soap and water once a day then pat dry with clean gauze. Apply a thing coat of Petrolatum (petroleum jelly, "Vaseline") over the wound (unless you have an allergy to this). We recommend that you use a new, sterile tube of Vaseline. Do not pick or remove scabs. Do not remove the yellow or white "healing tissue" from the base of the wound. ? ?Cover the wound with fresh, clean, nonstick gauze and secure with paper tape. You may use Band-Aids in place of gauze and tape if the would is small enough, but would recommend trimming much of the tape off as there is often too much. Sometimes Band-Aids can irritate the skin. ? ?You should call the office for your biopsy report after 1 week if you have not already been contacted. ? ?If you experience any problems, such as abnormal amounts of bleeding, swelling, significant bruising, significant pain, or evidence of infection, please call the office immediately. ? ?FOR ADULT SURGERY PATIENTS: If you need something for pain relief you may take 1 extra strength Tylenol (acetaminophen) AND 2 Ibuprofen (222m each) together every 4 hours as needed for pain. (do not take these if you are allergic to them or if you have a reason you should not take them.) Typically, you may only need pain medication for 1 to 3 days.  ? ? ?If You Need Anything After Your Visit ? ?If you have any questions or concerns for your doctor, please call our main line at [phone] press option 4 to reach your doctor's medical assistant. If no one answers, please leave a voicemail as directed and we will return your call as soon as possible. Messages left after 4 pm will be answered the following business day.  ? ?You may also send [place] message via MyChart. We typically respond to MyChart messages within 1-2 business days. ? ?For prescription refills, please ask your pharmacy to contact our office. Our fax number is [phone] ? ?If you have an urgent  issue when the clinic is closed that cannot wait until the next business day, you can page your doctor at the number below.   ? ?Please note that while we do our best to be available for urgent issues outside of office hours, we are not available 24/7.  ? ?If you have an urgent issue and are unable to reach [place] you may choose to seek medical care at your doctor's office, retail clinic, urgent care center, or emergency room. ? ?If you have a medical emergency, please immediately call 911 or go to the emergency department. ? ?Pager Numbers ? ?- Dr. KNehemiah Massed [phone]? ?- Dr. MLaurence Ferrari [phone]? ?- Dr. SNicole Kindred [phone]? ?In the event of inclement weather, please call our main line at [phone] an update on the status of any delays or closures. ? ?Dermatology Medication Tips: ?Please keep the boxes that topical medications come in in order to help keep track of the instructions about where and how to use these. Pharmacies typically print the medication instructions only on the boxes and not directly on the medication tubes.  ? ?If your medication is too expensive, please contact our office at [phone] 4 or send [place] message through [place]  ? ?We are unable to tell what your co-pay for medications will be in advance as this is different depending on your insurance coverage. However, we may be able to find a substitute medication at lower cost or fill  out paperwork to get insurance to cover a needed medication.  ? ?If a prior authorization is required to get your medication covered by your insurance company, please allow [place] 1-2 business days to complete this process. ? ?Drug prices often vary depending on where the prescription is filled and some pharmacies may offer cheaper prices. ? ?The website www.goodrx.com contains coupons for medications through different pharmacies. The prices here do not account for what the cost may be with help from insurance (it may be cheaper with your  insurance), but the website can give you the price if you did not use any insurance.  ?- You can print the associated coupon and take it with your prescription to the pharmacy.  ?- You may also stop by our office during regular business hours and pick up a GoodRx coupon card.  ?- If you need your prescription sent electronically to a different pharmacy, notify our office through [facility] or by phone at [phone] option 4. ? ? ? ? ?Si Usted Necesita Algo Despu?s de Su Visita ? ?Tambi?n puede enviarnos un mensaje a trav?s de MyChart. Por lo general respondemos a los mensajes de MyChart en el transcurso de 1 a 2 d?as h?biles. ? ?Para renovar recetas, por favor pida a su farmacia que se ponga en contacto con nuestra oficina. Nuestro n?mero de fax es el [phone]. ? ?Si tiene un asunto urgente cuando la cl?nica est? cerrada y que no puede esperar hasta el siguiente d?a h?bil, puede llamar/localizar a su doctor(a) al n?mero que aparece a continuaci?n.  ? ?Por favor, tenga en cuenta que aunque hacemos todo lo posible para estar disponibles para asuntos urgentes fuera del horario de oficina, no estamos disponibles las 24 horas del d?a, los 7 d?as de la semana.  ? ?Si tiene un problema urgente y no puede comunicarse con nosotros, puede optar por buscar atenci?n m?dica  en el consultorio de su doctor(a), en una cl?nica privada, en un centro de atenci?n urgente o en una sala de emergencias. ? ?Si tiene Engineer, maintenance (IT) m?dica, por favor llame inmediatamente al 911 o vaya a la sala de emergencias. ? ?N?meros de b?per ? ?- Dr. Nehemiah Massed: [phone] ? ?- Dra. Moye: [phone] ? ?- Dra. Nicole Kindred: [phone] ? ?En caso de inclemencias del tiempo, por favor llame a nuestra l?nea principal al [phone] para una actualizaci?n sobre el estado de cualquier retraso o cierre. ? ?Consejos para la medicaci?n en dermatolog?a: ?Por favor, guarde las cajas en las que vienen los medicamentos de uso t?pico para ayudarle a  seguir las instrucciones sobre d?nde y c?mo usarlos. Las farmacias generalmente imprimen las instrucciones del medicamento s?lo en las cajas y no directamente en los tubos del [place].  ? ?Si su medicamento es muy caro, por favor, p?ngase en contacto con Zigmund Daniel llamando al [phone] y presione la opci?n 4 o env?enos un mensaje a trav?s de MyChart.  ? ?No podemos decirle cu?l ser? su copago por los medicamentos por adelantado ya que esto es diferente dependiendo de la cobertura de su seguro. Sin embargo, es posible que podamos encontrar un medicamento sustituto a Electrical engineer un formulario para que el seguro cubra el medicamento que se considera necesario.  ? ?Si se requiere Ardelia Mems autorizaci?n previa para que su compa??a de seguros [place] su medicamento, por favor perm?tanos de 1 a 2 d?as h?biles para completar este proceso. ? ?Los precios de los medicamentos var?an con frecuencia dependiendo del Environmental consultant de d?nde se surte la receta y  alguna farmacias pueden ofrecer precios m?s baratos. ? ?El sitio web www.goodrx.com tiene cupones para medicamentos de Airline pilot. Los precios aqu? no tienen en cuenta lo que podr?a costar con la ayuda del seguro (puede ser m?s barato con su seguro), pero el sitio web puede darle el precio si no utiliz? ning?n seguro.  ?- Puede imprimir el cup?n correspondiente y llevarlo con su receta a la farmacia.  ?- Tambi?n puede pasar por nuestra oficina durante el horario de atenci?n regular y recoger una tarjeta de cupones de GoodRx.  ?- Si necesita que su receta se env?e electr?nicamente a Chiropodist, informe a nuestra oficina a trav?s de MyChart de [place] o por tel?fono llamando al [phone] y presione la opci?n 4. ? ?

## 2021-09-06 NOTE — Progress Notes (Signed)
? ?  Follow-Up Visit ?  ?Subjective  ?Krista Jackson is a 51 y.o. female who presents for the following: Procedure (Patient here today to treat bx proven BCC at right posterior neck. ). ? ? ?The following portions of the chart were reviewed this encounter and updated as appropriate:  ?  ?  ? ?Review of Systems:  No other skin or systemic complaints except as noted in HPI or Assessment and Plan. ? ?Objective  ?Well appearing patient in no apparent distress; mood and affect are within normal limits. ? ?A focused examination was performed including neck. Relevant physical exam findings are noted in the Assessment and Plan. ? ?Right Posterior Neck ?Healing biopsy site ? ? ? ?Assessment & Plan  ?Basal cell carcinoma (BCC) of skin of neck ?Right Posterior Neck ? ?Destruction of lesion ? ?Destruction method: electrodesiccation and curettage   ?Informed consent: discussed and consent obtained   ?Timeout:  patient name, date of birth, surgical site, and procedure verified ?Anesthesia: the lesion was anesthetized in a standard fashion   ?Anesthetic:  1% lidocaine w/ epinephrine 1-100,000 local infiltration ?Curettage performed in three different directions: Yes   ?Electrodesiccation performed over the curetted area: Yes   ?Final wound size (cm):  1.2 ?Hemostasis achieved with:  pressure, aluminum chloride and electrodesiccation ?Outcome: patient tolerated procedure well with no complications   ?Post-procedure details: wound care instructions given   ?Additional details:  Mupirocin ointment and Bandaid applied ?  ? ? ?Return for TBSE, as scheduled. ? ?Graciella Belton, RMA, am acting as scribe for Brendolyn Patty, MD . ? ?Documentation: I have reviewed the above documentation for accuracy and completeness, and I agree with the above. ? ?Brendolyn Patty MD  ? ?

## 2021-10-11 ENCOUNTER — Encounter: Payer: No Typology Code available for payment source | Admitting: Dermatology

## 2021-11-10 ENCOUNTER — Encounter: Payer: Self-pay | Admitting: Dermatology

## 2021-11-10 ENCOUNTER — Ambulatory Visit (INDEPENDENT_AMBULATORY_CARE_PROVIDER_SITE_OTHER): Payer: No Typology Code available for payment source | Admitting: Dermatology

## 2021-11-10 DIAGNOSIS — L720 Epidermal cyst: Secondary | ICD-10-CM

## 2021-11-10 DIAGNOSIS — D1800 Hemangioma unspecified site: Secondary | ICD-10-CM

## 2021-11-10 DIAGNOSIS — Z1283 Encounter for screening for malignant neoplasm of skin: Secondary | ICD-10-CM | POA: Diagnosis not present

## 2021-11-10 DIAGNOSIS — L814 Other melanin hyperpigmentation: Secondary | ICD-10-CM

## 2021-11-10 DIAGNOSIS — L578 Other skin changes due to chronic exposure to nonionizing radiation: Secondary | ICD-10-CM

## 2021-11-10 DIAGNOSIS — Z85828 Personal history of other malignant neoplasm of skin: Secondary | ICD-10-CM | POA: Diagnosis not present

## 2021-11-10 DIAGNOSIS — D2272 Melanocytic nevi of left lower limb, including hip: Secondary | ICD-10-CM | POA: Diagnosis not present

## 2021-11-10 DIAGNOSIS — D229 Melanocytic nevi, unspecified: Secondary | ICD-10-CM

## 2021-11-10 NOTE — Progress Notes (Signed)
   Follow-Up Visit   Subjective  Krista Jackson is a 51 y.o. female who presents for the following: Total body skin exam (Hx of BCC R post neck).  The patient presents for Total-Body Skin Exam (TBSE) for skin cancer screening and mole check.  The patient has spots, moles and lesions to be evaluated, some may be new or changing and the patient has concerns that these could be cancer.   The following portions of the chart were reviewed this encounter and updated as appropriate:       Review of Systems:  No other skin or systemic complaints except as noted in HPI or Assessment and Plan.  Objective  Well appearing patient in no apparent distress; mood and affect are within normal limits.  A full examination was performed including scalp, head, eyes, ears, nose, lips, neck, chest, axillae, abdomen, back, buttocks, bilateral upper extremities, bilateral lower extremities, hands, feet, fingers, toes, fingernails, and toenails. All findings within normal limits unless otherwise noted below.  R post neck Slightly firm pink smooth scar   R upper medial thigh 4.28m med brown macule  face Smooth white papule(s).     Assessment & Plan   Lentigines - Scattered tan macules - Due to sun exposure - Benign-appearing, observe - Recommend daily broad spectrum sunscreen SPF 30+ to sun-exposed areas, reapply every 2 hours as needed. - Call for any changes - back  Melanocytic Nevi - Tan-brown and/or pink-flesh-colored symmetric macules and papules - Benign appearing on exam today - Observation - Call clinic for new or changing moles - Recommend daily use of broad spectrum spf 30+ sunscreen to sun-exposed areas.  - back, arms, abdomen, face  Hemangiomas - Red papules - Discussed benign nature - Observe - Call for any changes - back, abdomen  Actinic Damage - Chronic condition, secondary to cumulative UV/sun exposure - diffuse scaly erythematous macules with underlying  dyspigmentation - Recommend daily broad spectrum sunscreen SPF 30+ to sun-exposed areas, reapply every 2 hours as needed.  - Staying in the shade or wearing long sleeves, sun glasses (UVA+UVB protection) and wide brim hats (4-inch brim around the entire circumference of the hat) are also recommended for sun protection.  - Call for new or changing lesions. - chest  Skin cancer screening performed today.  History of basal cell carcinoma (BCC) R post neck  With Hypertrophic scar Clear. Observe for recurrence. Call clinic for new or changing lesions.  Recommend regular skin exams, daily broad-spectrum spf 30+ sunscreen use, and photoprotection.    Nevus R upper medial thigh  Benign-appearing.  Observation.  Call clinic for new or changing lesions.  Recommend daily use of broad spectrum spf 30+ sunscreen to sun-exposed areas.    Milia face  Benign, observe  Discussed topical retinoid, discussed acne surgery  Samples of Neutrogena retinol, [place] Effaclar samples given    Return in about 1 year (around 11/11/2022) for TBSE, Hx of BCC.  I, SOthelia Pulling RMA, am acting as scribe for TBrendolyn Patty MD .  Documentation: I have reviewed the above documentation for accuracy and completeness, and I agree with the above.  TBrendolyn PattyMD

## 2021-11-10 NOTE — Patient Instructions (Addendum)
Over the counter retinoid for the white bumps on face Differin 1% gel  Serica scar gel    Due to recent changes in healthcare laws, you may see results of your pathology and/or laboratory studies on MyChart before the doctors have had a chance to review them. We understand that in some cases there may be results that are confusing or concerning to you. Please understand that not all results are received at the same time and often the doctors may need to interpret multiple results in order to provide you with the best plan of care or course of treatment. Therefore, we ask that you please give [place] 2 business days to thoroughly review all your results before contacting the office for clarification. Should we see a critical lab result, you will be contacted sooner.   If You Need Anything After Your Visit  If you have any questions or concerns for your doctor, please call our main line at [phone] and press option 4 to reach your doctor's medical assistant. If no one answers, please leave a voicemail as directed and we will return your call as soon as possible. Messages left after 4 pm will be answered the following business day.   You may also send [place] a message via [place]. We typically respond to MyChart messages within 1-2 business days.  For prescription refills, please ask your pharmacy to contact our office. Our fax number is [phone].  If you have an urgent issue when the clinic is closed that cannot wait until the next business day, you can page your doctor at the number below.    Please note that while we do our best to be available for urgent issues outside of office hours, we are not available 24/7.   If you have an urgent issue and are unable to reach [place], you may choose to seek medical care at your doctor's office, retail clinic, urgent care center, or emergency room.  If you have a medical emergency, please immediately call 911 or go to the emergency department.  Pager  Numbers  - Dr. Nehemiah Massed: [phone]  - Dr. Laurence Ferrari: [phone]  - Dr. Nicole Kindred: [phone]  In the event of inclement weather, please call our main line at [phone] for an update on the status of any delays or closures.  Dermatology Medication Tips: Please keep the boxes that topical medications come in in order to help keep track of the instructions about where and how to use these. Pharmacies typically print the medication instructions only on the boxes and not directly on the medication tubes.   If your medication is too expensive, please contact our office at [phone] option 4 or send [place] a message through [place].   We are unable to tell what your co-pay for medications will be in advance as this is different depending on your insurance coverage. However, we may be able to find a substitute medication at lower cost or fill out paperwork to get insurance to cover a needed medication.   If a prior authorization is required to get your medication covered by your insurance company, please allow [place] 1-2 business days to complete this process.  Drug prices often vary depending on where the prescription is filled and some pharmacies may offer cheaper prices.  The website www.goodrx.com contains coupons for medications through different pharmacies. The prices here do not account for what the cost may be with help from insurance (it may be cheaper with your insurance), but the website can give you the  price if you did not use any insurance.  - You can print the associated coupon and take it with your prescription to the pharmacy.  - You may also stop by our office during regular business hours and pick up a GoodRx coupon card.  - If you need your prescription sent electronically to a different pharmacy, notify our office through [facility] or by phone at [phone] option 4.     Si Usted Necesita Algo Despus de Su Visita  Tambin puede enviarnos un mensaje a travs de  Pharmacist, community. Por lo general respondemos a los mensajes de MyChart en el transcurso de 1 a 2 das hbiles.  Para renovar recetas, por favor pida a su farmacia que se ponga en contacto con nuestra oficina. Harland Dingwall de fax es [place] [phone].  Si tiene un asunto urgente cuando la clnica est cerrada y que no puede esperar hasta el siguiente da hbil, puede llamar/localizar a su doctor(a) al nmero que aparece a continuacin.   Por favor, tenga en cuenta que aunque hacemos todo lo posible para estar disponibles para asuntos urgentes fuera del horario de [place], no estamos disponibles las 24 horas del da, los 7 das de la [place].   Si tiene un problema urgente y no puede comunicarse con nosotros, puede optar por buscar atencin mdica  en el consultorio de su doctor(a), en una clnica privada, en un centro de atencin urgente o en una sala de emergencias.  Si tiene Engineering geologist, por favor llame inmediatamente al 911 o vaya a la sala de emergencias.  Nmeros de bper  - Dr. Nehemiah Massed: [phone]  - Dra. Moye: [phone]  - Dra. Nicole Kindred: [phone]  En caso de inclemencias del [place], por favor llame a Johnsie Kindred principal al [phone] para una actualizacin sobre el [place] de cualquier retraso o cierre.  Consejos para la medicacin en dermatologa: Por favor, guarde las cajas en las que vienen los medicamentos de uso tpico para ayudarle a seguir las instrucciones sobre dnde y cmo usarlos. Las farmacias generalmente imprimen las instrucciones del medicamento slo en las cajas y no directamente en los tubos del [place].   Si su medicamento es muy caro, por favor, pngase en contacto con Zigmund Daniel llamando al [phone] y presione la opcin 4 o envenos un mensaje a travs de Pharmacist, community.   No podemos decirle cul ser su copago por los medicamentos por adelantado ya que esto es diferente dependiendo de la cobertura de su seguro. Sin embargo, es posible que  podamos encontrar un medicamento sustituto a Electrical engineer un formulario para que el seguro cubra el medicamento que se considera necesario.   Si se requiere una autorizacin previa para que su compaa de seguros [place] su medicamento, por favor permtanos de 1 a 2 das hbiles para completar este proceso.  Los precios de los medicamentos varan con frecuencia dependiendo del Environmental consultant de dnde se surte la receta y alguna farmacias pueden ofrecer precios ms baratos.  El sitio web www.goodrx.com tiene cupones para medicamentos de Airline pilot. Los precios aqu no tienen en cuenta lo que podra costar con la ayuda del seguro (puede ser ms barato con su seguro), pero el sitio web puede darle el precio si no utiliz Research scientist (physical sciences).  - Puede imprimir el cupn correspondiente y llevarlo con su receta a la farmacia.  - Tambin puede pasar por nuestra oficina durante el horario de atencin regular y Charity fundraiser una tarjeta de cupones de GoodRx.  - Si necesita que  su receta se enve electrnicamente a una farmacia diferente, informe a nuestra oficina a travs de MyChart de [place] o por telfono llamando al [phone] y presione la opcin 4.

## 2021-11-24 ENCOUNTER — Encounter: Payer: Self-pay | Admitting: Nurse Practitioner

## 2021-11-24 LAB — COLOGUARD

## 2021-11-25 ENCOUNTER — Other Ambulatory Visit: Payer: Self-pay

## 2021-11-25 DIAGNOSIS — Z1211 Encounter for screening for malignant neoplasm of colon: Secondary | ICD-10-CM

## 2021-12-30 LAB — COLOGUARD: COLOGUARD: NEGATIVE

## 2022-01-21 ENCOUNTER — Encounter: Payer: No Typology Code available for payment source | Admitting: Nurse Practitioner

## 2022-02-16 ENCOUNTER — Encounter: Payer: No Typology Code available for payment source | Admitting: Nurse Practitioner

## 2022-02-17 ENCOUNTER — Ambulatory Visit (INDEPENDENT_AMBULATORY_CARE_PROVIDER_SITE_OTHER): Payer: No Typology Code available for payment source | Admitting: Nurse Practitioner

## 2022-02-17 ENCOUNTER — Encounter: Payer: Self-pay | Admitting: Nurse Practitioner

## 2022-02-17 ENCOUNTER — Other Ambulatory Visit (HOSPITAL_COMMUNITY)
Admission: RE | Admit: 2022-02-17 | Discharge: 2022-02-17 | Disposition: A | Discharge status: Home / Self Care | Payer: No Typology Code available for payment source | Source: Ambulatory Visit | Attending: Nurse Practitioner | Admitting: Nurse Practitioner

## 2022-02-17 VITALS — BP 124/74 | HR 60 | Temp 97.5°F | Ht 68.0 in | Wt 182.0 lb

## 2022-02-17 DIAGNOSIS — R7301 Impaired fasting glucose: Secondary | ICD-10-CM | POA: Diagnosis not present

## 2022-02-17 DIAGNOSIS — Z0001 Encounter for general adult medical examination with abnormal findings: Secondary | ICD-10-CM | POA: Diagnosis present

## 2022-02-17 DIAGNOSIS — Z124 Encounter for screening for malignant neoplasm of cervix: Secondary | ICD-10-CM | POA: Insufficient documentation

## 2022-02-17 DIAGNOSIS — Z1231 Encounter for screening mammogram for malignant neoplasm of breast: Secondary | ICD-10-CM

## 2022-02-17 DIAGNOSIS — E782 Mixed hyperlipidemia: Secondary | ICD-10-CM | POA: Diagnosis not present

## 2022-02-17 LAB — CBC
HCT: 40.1 % (ref 36.0–46.0)
Hemoglobin: 13.5 g/dL (ref 12.0–15.0)
MCHC: 33.6 g/dL (ref 30.0–36.0)
MCV: 81.1 fl (ref 78.0–100.0)
Platelets: 248 10*3/uL (ref 150.0–400.0)
RBC: 4.95 Mil/uL (ref 3.87–5.11)
RDW: 14 % (ref 11.5–15.5)
WBC: 6.6 10*3/uL (ref 4.0–10.5)

## 2022-02-17 LAB — COMPREHENSIVE METABOLIC PANEL
ALT: 16 U/L (ref 0–35)
AST: 19 U/L (ref 0–37)
Albumin: 4 g/dL (ref 3.5–5.2)
Alkaline Phosphatase: 71 U/L (ref 39–117)
BUN: 16 mg/dL (ref 6–23)
CO2: 26 mEq/L (ref 19–32)
Calcium: 8.9 mg/dL (ref 8.4–10.5)
Chloride: 103 mEq/L (ref 96–112)
Creatinine, Ser: 0.83 mg/dL (ref 0.40–1.20)
GFR: 83.03 mL/min (ref >60.00)
Glucose, Bld: 93 mg/dL (ref 70–99)
Potassium: 3.9 mEq/L (ref 3.5–5.1)
Sodium: 136 mEq/L (ref 135–145)
Total Bilirubin: 0.3 mg/dL (ref 0.2–1.2)
Total Protein: 7.1 g/dL (ref 6.0–8.3)

## 2022-02-17 LAB — LIPID PANEL
Cholesterol: 209 mg/dL — ABNORMAL HIGH (ref 0–200)
HDL: 52.4 mg/dL (ref >39.00)
LDL Cholesterol: 135 mg/dL — ABNORMAL HIGH (ref 0–99)
NonHDL: 156.89
Total CHOL/HDL Ratio: 4
Triglycerides: 110 mg/dL (ref 0.0–149.0)
VLDL: 22 mg/dL (ref 0.0–40.0)

## 2022-02-17 LAB — HEMOGLOBIN A1C: Hgb A1c MFr Bld: 5.8 % (ref 4.6–6.5)

## 2022-02-17 NOTE — Progress Notes (Signed)
C-

## 2022-02-17 NOTE — Patient Instructions (Addendum)
Go to lab Start memory exercise  Management of Memory Problems  There are some general things you can do to help manage your memory problems.  Your memory may not in fact recover, but by using techniques and strategies you will be able to manage your memory difficulties better.  1)  Establish a routine. Try to establish and then stick to a regular routine.  By doing this, you will get used to what to expect and you will reduce the need to rely on your memory.  Also, try to do things at the same time of day, such as taking your medication or checking your calendar first thing in the morning. Think about think that you can do as a part of a regular routine and make a list.  Then enter them into a daily planner to remind you.  This will help you establish a routine.  2)  Organize your environment. Organize your environment so that it is uncluttered.  Decrease visual stimulation.  Place everyday items such as keys or cell phone in the same place every day (ie.  Basket next to front door) Use post it notes with a brief message to yourself (ie. Turn off light, lock the door) Use labels to indicate where things go (ie. Which cupboards are for food, dishes, etc.) Keep a notepad and pen by the telephone to take messages  3)  Memory Aids A diary or journal/notebook/daily planner Making a list (shopping list, chore list, to do list that needs to be done) Using an alarm as a reminder (kitchen timer or cell phone alarm) Using cell phone to store information (Notes, Calendar, Reminders) Calendar/White board placed in a prominent position Post-it notes  In order for memory aids to be useful, you need to have good habits.  It's no good remembering to make a note in your journal if you don't remember to look in it.  Try setting aside a certain time of day to look in journal.  4)  Improving mood and managing fatigue. There may be other factors that contribute to memory difficulties.  Factors, such as  anxiety, depression and tiredness can affect memory. Regular gentle exercise can help improve your mood and give you more energy. Simple relaxation techniques may help relieve symptoms of anxiety Try to get back to completing activities or hobbies you enjoyed doing in the past. Learn to pace yourself through activities to decrease fatigue. Find out about some local support groups where you can share experiences with others. Try and achieve 7-8 hours of sleep at night.  Preventive Care 55-86 Years Old, Female Preventive care refers to lifestyle choices and visits with your health care provider that can promote health and wellness. Preventive care visits are also called wellness exams. What can I expect for my preventive care visit? Counseling Your health care provider may ask you questions about your: Medical history, including: Past medical problems. Family medical history. Pregnancy history. Current health, including: Menstrual cycle. Method of birth control. Emotional well-being. Home life and relationship well-being. Sexual activity and sexual health. Lifestyle, including: Alcohol, nicotine or tobacco, and drug use. Access to firearms. Diet, exercise, and sleep habits. Work and work Statistician. Sunscreen use. Safety issues such as seatbelt and bike helmet use. Physical exam Your health care provider will check your: Height and weight. These may be used to calculate your BMI (body mass index). BMI is a measurement that tells if you are at a healthy weight. Waist circumference. This measures the distance around your  waistline. This measurement also tells if you are at a healthy weight and may help predict your risk of certain diseases, such as type 2 diabetes and high blood pressure. Heart rate and blood pressure. Body temperature. Skin for abnormal spots. What immunizations do I need?  Vaccines are usually given at various ages, according to a schedule. Your health care  provider will recommend vaccines for you based on your age, medical history, and lifestyle or other factors, such as travel or where you work. What tests do I need? Screening Your health care provider may recommend screening tests for certain conditions. This may include: Lipid and cholesterol levels. Diabetes screening. This is done by checking your blood sugar (glucose) after you have not eaten for a while (fasting). Pelvic exam and Pap test. Hepatitis B test. Hepatitis C test. HIV (human immunodeficiency virus) test. STI (sexually transmitted infection) testing, if you are at risk. Lung cancer screening. Colorectal cancer screening. Mammogram. Talk with your health care provider about when you should start having regular mammograms. This may depend on whether you have a family history of breast cancer. BRCA-related cancer screening. This may be done if you have a family history of breast, ovarian, tubal, or peritoneal cancers. Bone density scan. This is done to screen for osteoporosis. Talk with your health care provider about your test results, treatment options, and if necessary, the need for more tests. Follow these instructions at home: Eating and drinking  Eat a diet that includes fresh fruits and vegetables, whole grains, lean protein, and low-fat dairy products. Take vitamin and mineral supplements as recommended by your health care provider. Do not drink alcohol if: Your health care provider tells you not to drink. You are pregnant, may be pregnant, or are planning to become pregnant. If you drink alcohol: Limit how much you have to 0-1 drink a day. Know how much alcohol is in your drink. In the [place] one drink equals one 12 oz bottle of beer (355 mL), one 5 oz glass of wine (148 mL), or one 1 oz glass of hard liquor (44 mL). Lifestyle Brush your teeth every morning and night with fluoride toothpaste. Floss one time each day. Exercise for at least 30 minutes 5 or more days  each week. Do not use any products that contain nicotine or tobacco. These products include cigarettes, chewing tobacco, and vaping devices, such as e-cigarettes. If you need help quitting, ask your health care provider. Do not use drugs. If you are sexually active, practice safe sex. Use a condom or other form of protection to prevent STIs. If you do not wish to become pregnant, use a form of birth control. If you plan to become pregnant, see your health care provider for a prepregnancy visit. Take aspirin only as told by your health care provider. Make sure that you understand how much to take and what form to take. Work with your health care provider to find out whether it is safe and beneficial for you to take aspirin daily. Find healthy ways to manage stress, such as: Meditation, yoga, or listening to music. Journaling. Talking to a trusted person. Spending time with friends and family. Minimize exposure to UV radiation to reduce your risk of skin cancer. Safety Always wear your seat belt while driving or riding in a vehicle. Do not drive: If you have been drinking alcohol. Do not ride with someone who has been drinking. When you are tired or distracted. While texting. If you have been using any mind-altering  substances or drugs. Wear a helmet and other protective equipment during sports activities. If you have firearms in your house, make sure you follow all gun safety procedures. Seek help if you have been physically or sexually abused. What's next? Visit your health care provider once a year for an annual wellness visit. Ask your health care provider how often you should have your eyes and teeth checked. Stay up to date on all vaccines. This information is not intended to replace advice given to you by your health care provider. Make sure you discuss any questions you have with your health care provider. Document Revised: 11/18/2020 Document Reviewed: 11/18/2020 Elsevier Patient  Education  [place].

## 2022-02-17 NOTE — Progress Notes (Signed)
Complete physical exam  Patient: Krista Jackson   DOB: 04-05-1971   51 y.o. Female  MRN: [id number] Visit Date: 02/17/2022  Subjective:    Chief Complaint  Patient presents with   Annual Exam    CPE Pt fasting C/o of a slight memory loss moment last month  Pap done today    Krista Jackson is a 51 y.o. female who presents today for a complete physical exam. She reports consuming a general diet. Home exercise routine includes walking and yoga daily. She generally feels well. She reports sleeping well. She does have additional problems to discuss today.  Vision:Yes Dental:Yes STD Screen:No  Most recent fall risk assessment:    02/17/2022    1:05 PM  Kell in the past year? 0  Number falls in past yr: 0  Injury with Fall? 0   Most recent depression screenings:    02/17/2022    1:31 PM 07/21/2021   10:39 AM  PHQ 2/9 Scores  PHQ - 2 Score 0 0  PHQ- 9 Score 0    HPI  Concerned about memory difficulty: 49monthago, she could not remember aunt's last name. She had to call another family member. Also report intermittent difficulty in finding words during conversation. Denies increased stress or inadequate sleep. She does acknowledge that she has not been able to multitask and is easily confused if given many tasks. Fhx of dementia: MGM at age 51 Past Medical History:  Diagnosis Date   Basal cell carcinoma 07/28/2021   [facility]   History reviewed. No pertinent surgical history. Social History   Socioeconomic History   Marital status: Married    Spouse name: Not on file   Number of children: Not on file   Years of education: Not on file   Highest education level: Not on file  Occupational History   Not on file  Tobacco Use   Smoking status: Never   Smokeless tobacco: Never  Vaping Use   Vaping Use: Never used  Substance and Sexual Activity   Alcohol use: Not Currently   Drug use: Never   Sexual activity: Yes    Birth  control/protection: Surgical    Comment: husband had vasectomy  Other Topics Concern   Not on file  Social History Narrative   Not on file   Social Determinants of Health   Financial Resource Strain: Not on file  Food Insecurity: Not on file  Transportation Needs: Not on file  Physical Activity: Not on file  Stress: Not on file  Social Connections: Not on file  Intimate Partner Violence: Not on file   Family Status  Relation Name Status   Mother  Alive   Father  (Not Specified)   Mat Aunt  (Not Specified)   Krista Jackson (Not Specified)   MGM  (Not Specified)   MGF  (Not Specified)   PGM  (Not Specified)   PGF  (Not Specified)   Family History  Problem Relation Age of Onset   Hyperlipidemia Mother    Arthritis Mother    Depression Mother    Miscarriages / SKoreaMother    Diabetes Father    Hyperlipidemia Father    Cancer Maternal Aunt [phone]      breast cancer   Cancer Paternal Aunt [phone]      breast cancer   Hyperlipidemia Maternal Grandmother    Stroke Maternal Grandmother    Heart disease Maternal Grandmother  Heart disease Maternal Grandfather    Heart attack Maternal Grandfather    Cancer Paternal Grandmother 49       breast cancer   Heart disease Paternal Grandfather    Hyperlipidemia Paternal Grandfather    Hearing loss Paternal Grandfather    Heart attack Paternal Grandfather    No Known Allergies  Patient Care Team: Krista Jackson, Krista Brooke, NP as PCP - General (Internal Medicine)   Medications: Outpatient Medications Prior to Visit  Medication Sig   Multiple Vitamin (MULTIVITAMIN) tablet Take 1 tablet by mouth daily.   Omega-3 Fatty Acids (FISH OIL) 1200 MG CAPS Take by mouth. otc   [DISCONTINUED] omeprazole (PRILOSEC) 20 MG capsule Take 1 capsule (20 mg total) by mouth 2 (two) times daily. (Patient not taking: Reported on 07/21/2021)   No facility-administered medications prior to visit.   Review of Systems  Constitutional:  Negative for fever.   HENT:  Negative for congestion and sore throat.   Eyes:        Negative for visual changes  Respiratory:  Negative for cough and shortness of breath.   Cardiovascular:  Negative for chest pain, palpitations and leg swelling.  Gastrointestinal:  Negative for blood in stool, constipation and diarrhea.  Genitourinary:  Negative for dysuria, frequency and urgency.  Musculoskeletal:  Negative for myalgias.  Skin:  Negative for rash.  Neurological:  Negative for dizziness and headaches.  Hematological:  Does not bruise/bleed easily.  Psychiatric/Behavioral:  Negative for agitation, behavioral problems, decreased concentration, dysphoric mood, sleep disturbance and suicidal ideas. The patient is not nervous/anxious and is not hyperactive.         Objective:  BP 124/74 (BP Location: Right Arm, Patient Position: Sitting, Cuff Size: Normal)   Pulse 60   Temp (!) 97.5 F (36.4 C) (Temporal)   Ht '5\' 8"'$  (1.727 m)   Wt 182 lb (82.6 kg)   SpO2 97%   BMI 27.67 kg/m       Physical Exam Vitals reviewed. Exam conducted with a chaperone present.  Constitutional:      General: She is not in acute distress.    Appearance: She is well-developed.  HENT:     Right Ear: Tympanic membrane, ear canal and external ear normal.     Left Ear: Tympanic membrane, ear canal and external ear normal.     Nose: Nose normal.  Eyes:     Extraocular Movements: Extraocular movements intact.     Conjunctiva/sclera: Conjunctivae normal.  Cardiovascular:     Rate and Rhythm: Normal rate and regular rhythm.     Pulses: Normal pulses.     Heart sounds: Normal heart sounds.  Pulmonary:     Effort: Pulmonary effort is normal. No respiratory distress.     Breath sounds: Normal breath sounds.  Chest:     Chest wall: No tenderness.  Breasts:    Breasts are symmetrical.     Right: Normal.     Left: Normal.  Abdominal:     General: Bowel sounds are normal.     Palpations: Abdomen is soft.     Hernia: There is  no hernia in the left inguinal area or right inguinal area.  Genitourinary:    General: Normal vulva.     Exam position: Lithotomy position.     Labia:        Right: No rash, tenderness or lesion.        Left: No rash, tenderness or lesion.      Vagina: Normal. No vaginal  discharge.     Cervix: Normal.     Uterus: Normal.      Adnexa: Right adnexa normal and left adnexa normal.  Musculoskeletal:        General: Normal range of motion.     Cervical back: Normal range of motion and neck supple.     Right lower leg: No edema.     Left lower leg: No edema.  Lymphadenopathy:     Cervical: No cervical adenopathy.     Upper Body:     Right upper body: No supraclavicular, axillary or pectoral adenopathy.     Left upper body: No supraclavicular, axillary or pectoral adenopathy.     Lower Body: No right inguinal adenopathy. No left inguinal adenopathy.  Neurological:     Mental Status: She is alert and oriented to person, place, and time.     Deep Tendon Reflexes: Reflexes are normal and symmetric.  Psychiatric:        Mood and Affect: Mood normal.        Behavior: Behavior normal.        Thought Content: Thought content normal.      No results found for any visits on 02/17/22.    Assessment & Plan:    Routine Health Maintenance and Physical Exam  Immunization History  Administered Date(s) Administered   Influenza,inj,Quad PF,6+ Mos 03/06/2018   Influenza-Unspecified 01/19/2019, 03/15/2021   PFIZER(Purple Top)SARS-COV-2 Vaccination 08/19/2019, 09/14/2019   PPD Test 01/23/2017   Tdap 01/23/2017    Health Maintenance  Topic Date Due   PAP SMEAR-Modifier  11/06/2020   INFLUENZA VACCINE  09/04/2022 (Originally 01/04/2022)   Hepatitis C Screening  02/18/2023 (Originally 02/21/1991)   HIV Screening  02/18/2023 (Originally 02/21/1988)   Fecal DNA (Cologuard)  12/23/2024   TETANUS/TDAP  01/24/2027   HPV VACCINES  Aged Out   COVID-19 Vaccine  Discontinued   Discussed health  benefits of physical activity, and encouraged her to engage in regular exercise appropriate for her age and condition.  Problem List Items Addressed This Visit       Endocrine   IFG (impaired fasting glucose)   Relevant Orders   Hemoglobin A1c     Other   Hyperlipidemia, unspecified   Relevant Orders   Lipid panel   Other Visit Diagnoses     Encounter for preventative adult health care exam with abnormal findings    -  Primary   Relevant Orders   CBC   Comprehensive metabolic panel   TSH   MM 3D SCREEN BREAST BILATERAL   Cytology - PAP   Encounter for Papanicolaou smear for cervical cancer screening       Relevant Orders   Cytology - PAP   Breast cancer screening by mammogram       Relevant Orders   MM 3D SCREEN BREAST BILATERAL      Return in about 1 year (around 02/18/2023) for CPE (fasting).     Wilfred Lacy, NP

## 2022-02-18 LAB — TSH: TSH: 1.81 u[IU]/mL (ref 0.35–5.50)

## 2022-02-22 LAB — CYTOLOGY - PAP
Comment: NEGATIVE
Diagnosis: NEGATIVE
High risk HPV: NEGATIVE

## 2022-03-31 ENCOUNTER — Other Ambulatory Visit: Payer: Self-pay | Admitting: Nurse Practitioner

## 2022-03-31 DIAGNOSIS — Z1231 Encounter for screening mammogram for malignant neoplasm of breast: Secondary | ICD-10-CM

## 2022-04-13 ENCOUNTER — Ambulatory Visit
Admission: RE | Admit: 2022-04-13 | Discharge: 2022-04-13 | Disposition: A | Discharge status: Home / Self Care | Payer: No Typology Code available for payment source | Source: Ambulatory Visit | Attending: Nurse Practitioner | Admitting: Nurse Practitioner

## 2022-04-13 DIAGNOSIS — Z1231 Encounter for screening mammogram for malignant neoplasm of breast: Secondary | ICD-10-CM

## 2022-04-15 ENCOUNTER — Other Ambulatory Visit: Payer: Self-pay | Admitting: Nurse Practitioner

## 2022-04-15 DIAGNOSIS — R928 Other abnormal and inconclusive findings on diagnostic imaging of breast: Secondary | ICD-10-CM

## 2022-04-26 ENCOUNTER — Ambulatory Visit: Payer: No Typology Code available for payment source

## 2022-04-26 ENCOUNTER — Ambulatory Visit
Admission: RE | Admit: 2022-04-26 | Discharge: 2022-04-26 | Disposition: A | Discharge status: Home / Self Care | Payer: No Typology Code available for payment source | Source: Ambulatory Visit | Attending: Nurse Practitioner | Admitting: Nurse Practitioner

## 2022-04-26 DIAGNOSIS — R928 Other abnormal and inconclusive findings on diagnostic imaging of breast: Secondary | ICD-10-CM

## 2022-07-21 ENCOUNTER — Ambulatory Visit: Admission: EM | Admit: 2022-07-21 | Discharge: 2022-07-21 | Discharge status: Against Medical Advice | Payer: 59

## 2022-07-21 ENCOUNTER — Telehealth: Payer: Self-pay | Admitting: Nurse Practitioner

## 2022-07-21 NOTE — Telephone Encounter (Signed)
Patient called in and said she is having a bad nose bleed and its been bleeding for 30 minutes. 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

## 2022-11-21 ENCOUNTER — Ambulatory Visit (INDEPENDENT_AMBULATORY_CARE_PROVIDER_SITE_OTHER): Payer: 59 | Admitting: Dermatology

## 2022-11-21 VITALS — BP 107/62 | HR 73

## 2022-11-21 DIAGNOSIS — L719 Rosacea, unspecified: Secondary | ICD-10-CM

## 2022-11-21 DIAGNOSIS — Z1283 Encounter for screening for malignant neoplasm of skin: Secondary | ICD-10-CM

## 2022-11-21 DIAGNOSIS — W908XXA Exposure to other nonionizing radiation, initial encounter: Secondary | ICD-10-CM

## 2022-11-21 DIAGNOSIS — D239 Other benign neoplasm of skin, unspecified: Secondary | ICD-10-CM

## 2022-11-21 DIAGNOSIS — L578 Other skin changes due to chronic exposure to nonionizing radiation: Secondary | ICD-10-CM | POA: Diagnosis not present

## 2022-11-21 DIAGNOSIS — D2271 Melanocytic nevi of right lower limb, including hip: Secondary | ICD-10-CM | POA: Diagnosis not present

## 2022-11-21 DIAGNOSIS — D229 Melanocytic nevi, unspecified: Secondary | ICD-10-CM

## 2022-11-21 DIAGNOSIS — X32XXXA Exposure to sunlight, initial encounter: Secondary | ICD-10-CM | POA: Diagnosis not present

## 2022-11-21 DIAGNOSIS — I781 Nevus, non-neoplastic: Secondary | ICD-10-CM

## 2022-11-21 DIAGNOSIS — D2371 Other benign neoplasm of skin of right lower limb, including hip: Secondary | ICD-10-CM

## 2022-11-21 DIAGNOSIS — L821 Other seborrheic keratosis: Secondary | ICD-10-CM

## 2022-11-21 DIAGNOSIS — L814 Other melanin hyperpigmentation: Secondary | ICD-10-CM | POA: Diagnosis not present

## 2022-11-21 DIAGNOSIS — D225 Melanocytic nevi of trunk: Secondary | ICD-10-CM

## 2022-11-21 DIAGNOSIS — Z85828 Personal history of other malignant neoplasm of skin: Secondary | ICD-10-CM

## 2022-11-21 NOTE — Progress Notes (Signed)
Follow-Up Visit   Subjective  Krista Jackson is a 52 y.o. female who presents for the following: Skin Cancer Screening and Full Body Skin Exam Hx of bcc, reports a small red spot at nose that has bled a couple weeks ago and a small spot at left lower leg she would like checked- been there a couple months.   The patient presents for Total-Body Skin Exam (TBSE) for skin cancer screening and mole check. The patient has spots, moles and lesions to be evaluated, some may be new or changing and the patient has concerns that these could be cancer.    The following portions of the chart were reviewed this encounter and updated as appropriate: medications, allergies, medical history  Review of Systems:  No other skin or systemic complaints except as noted in HPI or Assessment and Plan.  Objective  Well appearing patient in no apparent distress; mood and affect are within normal limits.  A full examination was performed including scalp, head, eyes, ears, nose, lips, neck, chest, axillae, abdomen, back, buttocks, bilateral upper extremities, bilateral lower extremities, hands, feet, fingers, toes, fingernails, and toenails. All findings within normal limits unless otherwise noted below.   Relevant physical exam findings are noted in the Assessment and Plan.    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 Currently well-controlled.   Rosacea is a chronic progressive skin condition usually affecting the face of adults, causing redness and/or acne bumps. It is treatable but not curable. It sometimes affects the eyes (ocular rosacea) as well. It may respond to topical and/or systemic medication and can flare with stress, sun exposure, alcohol, exercise, topical steroids (including hydrocortisone/cortisone 10) and some foods.  Daily application of broad spectrum spf 30+ sunscreen to face is  recommended to reduce flares.  Patient denies grittiness of the eyes  Treatment Plan Clear today at exam   Recommend daily broad spectrum sunscreen SPF 30+ to sun-exposed areas, reapply every 2 hours as needed. Call for new or changing lesions.  Staying in the shade or wearing long sleeves, sun glasses (UVA+UVB protection) and wide brim hats (4-inch brim around the entire circumference of the hat) are also recommended for sun protection.    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  Treatment options to remove include shave or excision with resulting scar and risk of recurrence.  Since benign-appearing and not bothersome, will observe for now.    LENTIGINES, SEBORRHEIC KERATOSES, HEMANGIOMAS - Benign normal skin lesions - Benign-appearing - Call for any changes  MELANOCYTIC NEVI - Tan-brown and/or pink-flesh-colored symmetric macules and papules - Benign appearing on exam today - Observation - Call clinic for new or changing moles - Recommend daily use of broad spectrum spf 30+ sunscreen to sun-exposed areas.  Nevus Exam:   4  medium brown macule R upper medial thigh  3 mm medium dark brown macule  left upper spinal back   3 mm medium brown macule at left chest  Treatment Plan:  Benign-appearing.  Observation.  Call clinic for new or changing lesions.  Recommend daily use of broad spectrum spf 30+ sunscreen to sun-exposed areas.    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  Observation.  Call clinic for new or changing lesions.  Recommend daily use of broad spectrum spf 30+ sunscreen to sun-exposed areas.    ACTINIC DAMAGE - Chronic condition, secondary to cumulative UV/sun exposure - diffuse scaly erythematous  macules with underlying dyspigmentation - Recommend daily broad spectrum sunscreen SPF 30+ to sun-exposed areas, reapply every 2 hours as needed.  - Staying in the shade or wearing long sleeves, sun glasses (UVA+UVB protection) and wide brim hats (4-inch brim around the entire circumference of the hat) are also recommended for sun protection.  - Call for new or changing lesions.  HISTORY OF BASAL CELL CARCINOMA OF THE SKIN Right posterior neck 07/2021 - No evidence of recurrence today - Recommend regular full body skin exams - Recommend daily broad spectrum sunscreen SPF 30+ to sun-exposed areas, reapply every 2 hours as needed.  - Call if any new or changing lesions are noted between office visits   SKIN CANCER SCREENING PERFORMED TODAY.    Return for 1 yr tbse.  I, Asher Muir, CMA, am acting as scribe for Willeen Niece, MD.   Documentation: I have reviewed the above documentation for accuracy and completeness, and I agree with the above.  Willeen Niece, MD

## 2022-11-21 NOTE — Patient Instructions (Addendum)
Possible Rosacea at nose   What is rosacea? Rosacea (say: ro-zay-sha) is a common skin disease that usually begins as a trend of flushing or blushing easily.  As rosacea progresses, a persistent redness in the center of the face will develop and may gradually spread beyond the nose and cheeks to the forehead and chin.  In some cases, the ears, chest, and back could be affected.  Rosacea may appear as tiny blood vessels or small red bumps that occur in crops.  Frequently they can contain pus, and are called "pustules".  If the bumps do not contain pus, they are referred to as "papules".  Rarely, in prolonged, untreated cases of rosacea, the oil glands of the nose and cheeks may become permanently enlarged.  This is called rhinophyma, and is seen more frequently in men.  Signs and Risks In its beginning stages, rosacea tends to come and go, which makes it difficult to recognize.  It can start as intermittent flushing of the face.  Eventually, blood vessels may become permanently visible.  Pustules and papules can appear, but can be mistaken for adult acne.  People of all races, ages, genders and ethnic groups are at risk of developing rosacea.  However, it is more common in women (especially around menopause) and adults with fair skin between the ages of 72 and 16.  Treatment Dermatologists typically recommend a combination of treatments to effectively manage rosacea.  Treatment can improve symptoms and may stop the progression of the rosacea.  Treatment may involve both topical and oral medications.  The tetracycline antibiotics are often used for their anti-inflammatory effect; however, because of the possibility of developing antibiotic resistance, they should not be used long term at full dose.  For dilated blood vessels the options include electrodessication (uses electric current through a small needle), laser treatment, and cosmetics to hide the redness.   With all forms of treatment, improvement is a  slow process, and patients may not see any results for the first 3-4 weeks.  It is very important to avoid the sun and other triggers.  Patients must wear sunscreen daily.  Skin Care Instructions: Cleanse the skin with a mild soap such as CeraVe cleanser, Cetaphil cleanser, or Dove soap once or twice daily as needed. Moisturize with Eucerin Redness Relief Daily Perfecting Lotion (has a subtle green tint), CeraVe Moisturizing Cream, or Oil of Olay Daily Moisturizer with sunscreen every morning and/or night as recommended. Makeup should be "non-comedogenic" (won't clog pores) and be labeled "for sensitive skin". Good choices for cosmetics are: Neutrogena, Almay, and Physician's Formula.  Any product with a green tint tends to offset a red complexion. If your eyes are dry and irritated, use artificial tears 2-3 times per day and cleanse the eyelids daily with baby shampoo.  Have your eyes examined at least every 2 years.  Be sure to tell your eye doctor that you have rosacea. Alcoholic beverages tend to cause flushing of the skin, and may make rosacea worse. Always wear sunscreen, protect your skin from extreme hot and cold temperatures, and avoid spicy foods, hot drinks, and mechanical irritation such as rubbing, scrubbing, or massaging the face.  Avoid harsh skin cleansers, cleansing masks, astringents, and exfoliation. If a particular product burns or makes your face feel tight, then it is likely to flare your rosacea. If you are having difficulty finding a sunscreen that you can tolerate, you may try switching to a chemical-free sunscreen.  These are ones whose active ingredient is zinc  oxide or titanium dioxide only.  They should also be fragrance free, non-comedogenic, and labeled for sensitive skin. Rosacea triggers may vary from person to person.  There are a variety of foods that have been reported to trigger rosacea.  Some patients find that keeping a diary of what they were doing when they flared  helps them avoid triggers.      Melanoma ABCDEs  Melanoma is the most dangerous type of skin cancer, and is the leading cause of death from skin disease.  You are more likely to develop melanoma if you: Have light-colored skin, light-colored eyes, or red or blond hair Spend a lot of time in the sun Tan regularly, either outdoors or in a tanning bed Have had blistering sunburns, especially during childhood Have a close family member who has had a melanoma Have atypical moles or large birthmarks  Early detection of melanoma is key since treatment is typically straightforward and cure rates are extremely high if we catch it early.   The first sign of melanoma is often a change in a mole or a new dark spot.  The ABCDE system is a way of remembering the signs of melanoma.  A for asymmetry:  The two halves do not match. B for border:  The edges of the growth are irregular. C for color:  A mixture of colors are present instead of an even brown color. D for diameter:  Melanomas are usually (but not always) greater than 6mm - the size of a pencil eraser. E for evolution:  The spot keeps changing in size, shape, and color.  Please check your skin once per month between visits. You can use a small mirror in front and a large mirror behind you to keep an eye on the back side or your body.   If you see any new or changing lesions before your next follow-up, please call to schedule a visit.  Please continue daily skin protection including broad spectrum sunscreen SPF 30+ to sun-exposed areas, reapplying every 2 hours as needed when you're outdoors.   Staying in the shade or wearing long sleeves, sun glasses (UVA+UVB protection) and wide brim hats (4-inch brim around the entire circumference of the hat) are also recommended for sun protection.       Due to recent changes in healthcare laws, you may see results of your pathology and/or laboratory studies on MyChart before the doctors have had a  chance to review them. We understand that in some cases there may be results that are confusing or concerning to you. Please understand that not all results are received at the same time and often the doctors may need to interpret multiple results in order to provide you with the best plan of care or course of treatment. Therefore, we ask that you please give [place] 2 business days to thoroughly review all your results before contacting the office for clarification. Should we see a critical lab result, you will be contacted sooner.   If You Need Anything After Your Visit  If you have any questions or concerns for your doctor, please call our main line at [phone] and press option 4 to reach your doctor's medical assistant. If no one answers, please leave a voicemail as directed and we will return your call as soon as possible. Messages left after 4 pm will be answered the following business day.   You may also send [place] a message via MyChart. We typically respond to MyChart messages within 1-2 business  days.  For prescription refills, please ask your pharmacy to contact our office. Our fax number is [phone].  If you have an urgent issue when the clinic is closed that cannot wait until the next business day, you can page your doctor at the number below.    Please note that while we do our best to be available for urgent issues outside of office hours, we are not available 24/7.   If you have an urgent issue and are unable to reach [place], you may choose to seek medical care at your doctor's office, retail clinic, urgent care center, or emergency room.  If you have a medical emergency, please immediately call 911 or go to the emergency department.  Pager Numbers  - Dr. Gwen Pounds: [phone]  - Dr. Neale Burly: [phone]  - Dr. Roseanne Reno: [phone]  In the event of inclement weather, please call our main line at [phone] for an update on the status of any delays or closures.  Dermatology  Medication Tips: Please keep the boxes that topical medications come in in order to help keep track of the instructions about where and how to use these. Pharmacies typically print the medication instructions only on the boxes and not directly on the medication tubes.   If your medication is too expensive, please contact our office at [phone] option 4 or send [place] a message through MyChart.   We are unable to tell what your co-pay for medications will be in advance as this is different depending on your insurance coverage. However, we may be able to find a substitute medication at lower cost or fill out paperwork to get insurance to cover a needed medication.   If a prior authorization is required to get your medication covered by your insurance company, please allow [place] 1-2 business days to complete this process.  Drug prices often vary depending on where the prescription is filled and some pharmacies may offer cheaper prices.  The website www.goodrx.com contains coupons for medications through different pharmacies. The prices here do not account for what the cost may be with help from insurance (it may be cheaper with your insurance), but the website can give you the price if you did not use any insurance.  - You can print the associated coupon and take it with your prescription to the pharmacy.  - You may also stop by our office during regular business hours and pick up a GoodRx coupon card.  - If you need your prescription sent electronically to a different pharmacy, notify our office through [facility] or by phone at [phone] option 4.     Si Usted Necesita Algo Despus de Su Visita  Tambin puede enviarnos un mensaje a travs de Clinical cytogeneticist. Por lo general respondemos a los mensajes de MyChart en el transcurso de 1 a 2 das hbiles.  Para renovar recetas, por favor pida a su farmacia que se ponga en contacto con nuestra oficina. Annie Sable de fax es [place] [phone].  Si  tiene un asunto urgente cuando la clnica est cerrada y que no puede esperar hasta el siguiente da hbil, puede llamar/localizar a su doctor(a) al nmero que aparece a continuacin.   Por favor, tenga en cuenta que aunque hacemos todo lo posible para estar disponibles para asuntos urgentes fuera del horario de [place], no estamos disponibles las 24 horas del da, los 7 [address]  [address] de la [place].   Si tiene un problema urgente y no puede comunicarse con nosotros, puede optar por buscar atencin mdica  en el consultorio de su doctor(a), en una clnica privada, en un centro de atencin urgente o en una sala de emergencias.  Si tiene Engineer, drilling, por favor llame inmediatamente al 911 o vaya a la sala de emergencias.  Nmeros de bper  - Dr. Gwen Pounds: [phone]  - Dra. Moye: [phone]  - Dra. Roseanne Reno: [phone]  En caso de inclemencias del [place], por favor llame a Lacy Duverney principal al [phone] para una actualizacin sobre el [place] de cualquier retraso o cierre.  Consejos para la medicacin en dermatologa: Por favor, guarde las cajas en las que vienen los medicamentos de uso tpico para ayudarle a seguir las instrucciones sobre dnde y cmo usarlos. Las farmacias generalmente imprimen las instrucciones del medicamento slo en las cajas y no directamente en los tubos del [place].   Si su medicamento es muy caro, por favor, pngase en contacto con Rolm Gala llamando al [phone] y presione la opcin 4 o envenos un mensaje a travs de Clinical cytogeneticist.   No podemos decirle cul ser su copago por los medicamentos por adelantado ya que esto es diferente dependiendo de la cobertura de su seguro. Sin embargo, es posible que podamos encontrar un medicamento sustituto a Audiological scientist un formulario para que el seguro cubra el medicamento que se considera necesario.   Si se requiere una autorizacin previa para que su compaa de seguros [place] su medicamento, por favor  permtanos de 1 a 2 das hbiles para completar [address]  Los precios de los medicamentos varan con frecuencia dependiendo del Environmental consultant de dnde se surte la receta y alguna farmacias pueden ofrecer precios ms baratos.  El sitio web www.goodrx.com tiene cupones para medicamentos de Health and safety inspector. Los precios aqu no tienen en cuenta lo que podra costar con la ayuda del seguro (puede ser ms barato con su seguro), pero el sitio web puede darle el precio si no utiliz Tourist information centre manager.  - Puede imprimir el cupn correspondiente y llevarlo con su receta a la farmacia.  - Tambin puede pasar por nuestra oficina durante el horario de atencin regular y Education officer, museum una tarjeta de cupones de GoodRx.  - Si necesita que su receta se enve electrnicamente a una farmacia diferente, informe a nuestra oficina a travs de MyChart de [place] o por telfono llamando al [phone] y presione la opcin 4.

## 2023-01-09 ENCOUNTER — Ambulatory Visit: Payer: 59 | Admitting: Nurse Practitioner

## 2023-01-09 ENCOUNTER — Encounter: Payer: Self-pay | Admitting: Nurse Practitioner

## 2023-01-09 VITALS — BP 106/64 | HR 73 | Temp 98.3°F | Resp 16 | Ht 68.0 in | Wt 182.0 lb

## 2023-01-09 DIAGNOSIS — Z0000 Encounter for general adult medical examination without abnormal findings: Secondary | ICD-10-CM | POA: Diagnosis not present

## 2023-01-09 DIAGNOSIS — Z803 Family history of malignant neoplasm of breast: Secondary | ICD-10-CM | POA: Diagnosis not present

## 2023-01-09 DIAGNOSIS — H90A22 Sensorineural hearing loss, unilateral, left ear, with restricted hearing on the contralateral side: Secondary | ICD-10-CM

## 2023-01-09 DIAGNOSIS — R7301 Impaired fasting glucose: Secondary | ICD-10-CM | POA: Diagnosis not present

## 2023-01-09 DIAGNOSIS — F518 Other sleep disorders not due to a substance or known physiological condition: Secondary | ICD-10-CM

## 2023-01-09 DIAGNOSIS — E782 Mixed hyperlipidemia: Secondary | ICD-10-CM | POA: Diagnosis not present

## 2023-01-09 DIAGNOSIS — Z0001 Encounter for general adult medical examination with abnormal findings: Secondary | ICD-10-CM

## 2023-01-09 DIAGNOSIS — G4720 Circadian rhythm sleep disorder, unspecified type: Secondary | ICD-10-CM

## 2023-01-09 DIAGNOSIS — H903 Sensorineural hearing loss, bilateral: Secondary | ICD-10-CM | POA: Insufficient documentation

## 2023-01-09 NOTE — Assessment & Plan Note (Signed)
Repeat hgbA1c 

## 2023-01-09 NOTE — Assessment & Plan Note (Signed)
Onset 6months ago Premature waking and difficulty falling asleep. Denies any hot flashes, no heartburn, no anxiety/depression, no Alcohol or caffeine. No improvement with melatonin  Advised to try magnesium glycinate OTC

## 2023-01-09 NOTE — Progress Notes (Signed)
Complete physical exam  Patient: Krista Jackson   DOB: April 28, 1971   52 y.o. Female  MRN: [id number] Visit Date: 01/09/2023  Subjective:    Chief Complaint  Patient presents with   Annual Exam    Not Fasting    Krista Jackson is a 52 y.o. female who presents today for a complete physical exam. She reports consuming a low fat and low sodium diet.  Yoga, walking daily  She generally feels fairly well. She reports sleeping fairly well. She does have additional problems to discuss today.  Vision:Yes Dental:Yes STD Screen:No  BP Readings from Last 3 Encounters:  01/09/23 106/64  11/21/22 107/62  02/17/22 124/74   Wt Readings from Last 3 Encounters:  01/09/23 182 lb (82.6 kg)  02/17/22 182 lb (82.6 kg)  07/21/21 181 lb 3.2 oz (82.2 kg)   Most recent fall risk assessment:    02/17/2022    1:05 PM  Fall Risk   Falls in the past year? 0  Number falls in past yr: 0  Injury with Fall? 0   Depression screen:Yes - No Depression Most recent depression screenings:    02/17/2022    1:31 PM 07/21/2021   10:39 AM  PHQ 2/9 Scores  PHQ - 2 Score 0 0  PHQ- 9 Score 0    HPI  Family history of breast cancer in first degree relative maternal aunt at age 82, paternal aunt at age 57, and paternal GM at age 47  She agreed to genetic counsel referral  Hyperlipidemia, unspecified Repeat lipid panel  IFG (impaired fasting glucose) Repeat hgbA1c  Sensorineural hearing loss (SNHL) of both ears Noticed difficulty understanding certain words or if there is background noise or if unable to see person's face. No hx of head or ear injury or repeated ear infection/tubes No hx of chronic loud noise exposure. Entered referral to audiology  Disrupted sleep-wake cycle Onset 6months ago Premature waking and difficulty falling asleep. Denies any hot flashes, no heartburn, no anxiety/depression, no Alcohol or caffeine. No improvement with melatonin  Advised to try magnesium glycinate  OTC   Past Medical History:  Diagnosis Date   Basal cell carcinoma 07/28/2021   R post neck, [facility] 09/06/21   History reviewed. No pertinent surgical history. Social History   Socioeconomic History   Marital status: Married    Spouse name: Not on file   Number of children: Not on file   Years of education: Not on file   Highest education level: Not on file  Occupational History   Not on file  Tobacco Use   Smoking status: Never   Smokeless tobacco: Never  Vaping Use   Vaping status: Never Used  Substance and Sexual Activity   Alcohol use: Not Currently   Drug use: Never   Sexual activity: Yes    Birth control/protection: Surgical    Comment: husband had vasectomy  Other Topics Concern   Not on file  Social History Narrative   Not on file   Social Determinants of Health   Financial Resource Strain: Not on file  Food Insecurity: Not on file  Transportation Needs: Not on file  Physical Activity: Not on file  Stress: Not on file  Social Connections: Not on file  Intimate Partner Violence: Not on file   Family Status  Relation Name Status   Mother  Alive   Father  (Not Specified)   Mat Aunt  (Not Specified)   [employer]  (Not Specified)   MGM  (Not  Specified)   MGF  (Not Specified)   PGM  (Not Specified)   PGF  (Not Specified)   Neg Hx  (Not Specified)  No partnership data on file   Family History  Problem Relation Age of Onset   Hyperlipidemia Mother    Arthritis Mother    Depression Mother    Miscarriages / [place] Mother    Diabetes Father    Hyperlipidemia Father    Cancer Maternal Aunt 67       breast cancer   Cancer Paternal Aunt 76       breast cancer   Hyperlipidemia Maternal Grandmother    Stroke Maternal Grandmother    Heart disease Maternal Grandmother    Heart disease Maternal Grandfather    Heart attack Maternal Grandfather    Cancer Paternal Grandmother 30       breast cancer   Heart disease Paternal Grandfather    Hyperlipidemia  Paternal Grandfather    Hearing loss Paternal Grandfather    Heart attack Paternal Grandfather    Breast cancer Neg Hx    No Known Allergies  Patient Care Team: Earmon Sherrow, Bonna Gains, NP as PCP - General (Internal Medicine)   Medications: Outpatient Medications Prior to Visit  Medication Sig   Multiple Vitamin (MULTIVITAMIN) tablet Take 1 tablet by mouth daily.   Omega-3 Fatty Acids (FISH OIL) 1200 MG CAPS Take by mouth. otc   No facility-administered medications prior to visit.    Review of Systems  Constitutional:  Negative for activity change, appetite change and unexpected weight change.  HENT:  Negative for ear pain and tinnitus.   Respiratory: Negative.    Cardiovascular: Negative.   Gastrointestinal: Negative.   Endocrine: Negative for cold intolerance and heat intolerance.  Genitourinary: Negative.   Musculoskeletal: Negative.   Skin: Negative.   Neurological: Negative.  Negative for dizziness.  Hematological: Negative.   Psychiatric/Behavioral:  Negative for behavioral problems, decreased concentration, dysphoric mood, hallucinations, self-injury, sleep disturbance and suicidal ideas. The patient is not nervous/anxious.        Objective:  BP 106/64 (BP Location: Left Arm, Patient Position: Sitting, Cuff Size: Normal)   Pulse 73   Temp 98.3 F (36.8 C) (Temporal)   Resp 16   Ht 5\' 8"  (1.727 m)   Wt 182 lb (82.6 kg)   LMP 01/01/2023   SpO2 98%   BMI 27.67 kg/m     Physical Exam Vitals and nursing note reviewed.  Constitutional:      General: She is not in acute distress. HENT:     Head: Normocephalic.     Right Ear: Tympanic membrane, ear canal and external ear normal. Decreased hearing noted.     Left Ear: Tympanic membrane, ear canal and external ear normal. Decreased hearing noted.     Ears:     Weber exam findings: Lateralizes right.    Right Rinne: AC > BC.    Left Rinne: AC > BC.    Nose: Nose normal.  Eyes:     Extraocular Movements:  Extraocular movements intact.     Conjunctiva/sclera: Conjunctivae normal.     Pupils: Pupils are equal, round, and reactive to light.  Neck:     Thyroid: No thyroid mass, thyromegaly or thyroid tenderness.  Cardiovascular:     Rate and Rhythm: Normal rate and regular rhythm.     Pulses: Normal pulses.     Heart sounds: Normal heart sounds.  Pulmonary:     Effort: Pulmonary effort is normal.  Breath sounds: Normal breath sounds.  Abdominal:     General: Bowel sounds are normal.     Palpations: Abdomen is soft.  Musculoskeletal:        General: Normal range of motion.     Cervical back: Normal range of motion and neck supple.     Right lower leg: No edema.     Left lower leg: No edema.  Lymphadenopathy:     Cervical: No cervical adenopathy.  Skin:    General: Skin is warm and dry.  Neurological:     Mental Status: She is alert and oriented to person, place, and time.     Cranial Nerves: No cranial nerve deficit.  Psychiatric:        Mood and Affect: Mood normal.        Behavior: Behavior normal.        Thought Content: Thought content normal.      No results found for any visits on 01/09/23.    Assessment & Plan:    Routine Health Maintenance and Physical Exam  Immunization History  Administered Date(s) Administered   Influenza,inj,Quad PF,6+ Mos 03/06/2018   Influenza-Unspecified 01/19/2019, 03/15/2021   PFIZER(Purple Top)SARS-COV-2 Vaccination 08/19/2019, 09/14/2019   PPD Test 01/23/2017   Tdap 01/23/2017    Health Maintenance  Topic Date Due   INFLUENZA VACCINE  01/05/2023   Hepatitis C Screening  02/18/2023 (Originally 02/21/1991)   HIV Screening  02/18/2023 (Originally 02/21/1988)   Fecal DNA (Cologuard)  12/23/2024   PAP SMEAR-Modifier  02/17/2025   DTaP/Tdap/Td (2 - Td or Tdap) 01/24/2027   HPV VACCINES  Aged Out   COVID-19 Vaccine  Discontinued   Discussed health benefits of physical activity, and encouraged her to engage in regular exercise  appropriate for her age and condition.  Problem List Items Addressed This Visit       Endocrine   IFG (impaired fasting glucose)    Repeat hgbA1c      Relevant Orders   Hemoglobin A1c     Nervous and Auditory   Disrupted sleep-wake cycle    Onset 6months ago Premature waking and difficulty falling asleep. Denies any hot flashes, no heartburn, no anxiety/depression, no Alcohol or caffeine. No improvement with melatonin  Advised to try magnesium glycinate OTC      Sensorineural hearing loss (SNHL) of both ears    Noticed difficulty understanding certain words or if there is background noise or if unable to see person's face. No hx of head or ear injury or repeated ear infection/tubes No hx of chronic loud noise exposure. Entered referral to audiology        Other   Family history of breast cancer in first degree relative    maternal aunt at age 83, paternal aunt at age 87, and paternal GM at age 60  She agreed to genetic counsel referral      Relevant Orders   Ambulatory referral to Genetics   Hyperlipidemia, unspecified    Repeat lipid panel      Relevant Orders   Lipid panel   Other Visit Diagnoses     Encounter for preventative adult health care exam with abnormal findings    -  Primary   Relevant Orders   Comprehensive metabolic panel      Return in about 1 year (around 01/09/2024) for CPE (fasting).     Alysia Penna, NP

## 2023-01-09 NOTE — Assessment & Plan Note (Signed)
Repeat lipid panel ?

## 2023-01-09 NOTE — Assessment & Plan Note (Signed)
maternal aunt at age 50, paternal aunt at age 78, and paternal GM at age 26  She agreed to genetic counsel referral

## 2023-01-09 NOTE — Assessment & Plan Note (Addendum)
Noticed difficulty understanding certain words or if there is background noise or if unable to see person's face. No hx of head or ear injury or repeated ear infection/tubes No hx of chronic loud noise exposure. Entered referral to audiology

## 2023-01-09 NOTE — Patient Instructions (Signed)
Try magnesium glycinate for sleep Go to lab Continue Heart healthy diet and daily exercise. Maintain vi. D 1000IU and calcium 800mg  daily for bone health.  Preventive Care 19-50 Years Old, Female Preventive care refers to lifestyle choices and visits with your health care provider that can promote health and wellness. Preventive care visits are also called wellness exams. What can I expect for my preventive care visit? Counseling Your health care provider may ask you questions about your: Medical history, including: Past medical problems. Family medical history. Pregnancy history. Current health, including: Menstrual cycle. Method of birth control. Emotional well-being. Home life and relationship well-being. Sexual activity and sexual health. Lifestyle, including: Alcohol, nicotine or tobacco, and drug use. Access to firearms. Diet, exercise, and sleep habits. Work and work Astronomer. Sunscreen use. Safety issues such as seatbelt and bike helmet use. Physical exam Your health care provider will check your: Height and weight. These may be used to calculate your BMI (body mass index). BMI is a measurement that tells if you are at a healthy weight. Waist circumference. This measures the distance around your waistline. This measurement also tells if you are at a healthy weight and may help predict your risk of certain diseases, such as type 2 diabetes and high blood pressure. Heart rate and blood pressure. Body temperature. Skin for abnormal spots. What immunizations do I need?  Vaccines are usually given at various ages, according to a schedule. Your health care provider will recommend vaccines for you based on your age, medical history, and lifestyle or other factors, such as travel or where you work. What tests do I need? Screening Your health care provider may recommend screening tests for certain conditions. This may include: Lipid and cholesterol levels. Diabetes  screening. This is done by checking your blood sugar (glucose) after you have not eaten for a while (fasting). Pelvic exam and Pap test. Hepatitis B test. Hepatitis C test. HIV (human immunodeficiency virus) test. STI (sexually transmitted infection) testing, if you are at risk. Lung cancer screening. Colorectal cancer screening. Mammogram. Talk with your health care provider about when you should start having regular mammograms. This may depend on whether you have a family history of breast cancer. BRCA-related cancer screening. This may be done if you have a family history of breast, ovarian, tubal, or peritoneal cancers. Bone density scan. This is done to screen for osteoporosis. Talk with your health care provider about your test results, treatment options, and if necessary, the need for more tests. Follow these instructions at home: Eating and drinking  Eat a diet that includes fresh fruits and vegetables, whole grains, lean protein, and low-fat dairy products. Take vitamin and mineral supplements as recommended by your health care provider. Do not drink alcohol if: Your health care provider tells you not to drink. You are pregnant, may be pregnant, or are planning to become pregnant. If you drink alcohol: Limit how much you have to 0-1 drink a day. Know how much alcohol is in your drink. In the [place] one drink equals one 12 oz bottle of beer (355 mL), one 5 oz glass of wine (148 mL), or one 1 oz glass of hard liquor (44 mL). Lifestyle Brush your teeth every morning and night with fluoride toothpaste. Floss one time each day. Exercise for at least 30 minutes 5 or more days each week. Do not use any products that contain nicotine or tobacco. These products include cigarettes, chewing tobacco, and vaping devices, such as e-cigarettes. If you need help  quitting, ask your health care provider. Do not use drugs. If you are sexually active, practice safe sex. Use a condom or other form of  protection to prevent STIs. If you do not wish to become pregnant, use a form of birth control. If you plan to become pregnant, see your health care provider for a prepregnancy visit. Take aspirin only as told by your health care provider. Make sure that you understand how much to take and what form to take. Work with your health care provider to find out whether it is safe and beneficial for you to take aspirin daily. Find healthy ways to manage stress, such as: Meditation, yoga, or listening to music. Journaling. Talking to a trusted person. Spending time with friends and family. Minimize exposure to UV radiation to reduce your risk of skin cancer. Safety Always wear your seat belt while driving or riding in a vehicle. Do not drive: If you have been drinking alcohol. Do not ride with someone who has been drinking. When you are tired or distracted. While texting. If you have been using any mind-altering substances or drugs. Wear a helmet and other protective equipment during sports activities. If you have firearms in your house, make sure you follow all gun safety procedures. Seek help if you have been physically or sexually abused. What's next? Visit your health care provider once a year for an annual wellness visit. Ask your health care provider how often you should have your eyes and teeth checked. Stay up to date on all vaccines. This information is not intended to replace advice given to you by your health care provider. Make sure you discuss any questions you have with your health care provider. Document Revised: 11/18/2020 Document Reviewed: 11/18/2020 Elsevier Patient Education  2024 [employer].

## 2023-01-20 ENCOUNTER — Other Ambulatory Visit: Payer: 59

## 2023-01-20 DIAGNOSIS — E782 Mixed hyperlipidemia: Secondary | ICD-10-CM | POA: Diagnosis not present

## 2023-01-20 DIAGNOSIS — Z0001 Encounter for general adult medical examination with abnormal findings: Secondary | ICD-10-CM | POA: Diagnosis not present

## 2023-01-20 DIAGNOSIS — R7301 Impaired fasting glucose: Secondary | ICD-10-CM

## 2023-01-20 LAB — LIPID PANEL
Cholesterol: 224 mg/dL — ABNORMAL HIGH (ref 0–200)
HDL: 46.1 mg/dL (ref >39.00)
LDL Cholesterol: 156 mg/dL — ABNORMAL HIGH (ref 0–99)
NonHDL: 178.13
Total CHOL/HDL Ratio: 5
Triglycerides: 113 mg/dL (ref 0.0–149.0)
VLDL: 22.6 mg/dL (ref 0.0–40.0)

## 2023-01-20 LAB — HEMOGLOBIN A1C: Hgb A1c MFr Bld: 5.7 % (ref 4.6–6.5)

## 2023-01-20 LAB — COMPREHENSIVE METABOLIC PANEL
ALT: 12 U/L (ref 0–35)
AST: 16 U/L (ref 0–37)
Albumin: 4.1 g/dL (ref 3.5–5.2)
Alkaline Phosphatase: 67 U/L (ref 39–117)
BUN: 12 mg/dL (ref 6–23)
CO2: 25 mEq/L (ref 19–32)
Calcium: 8.9 mg/dL (ref 8.4–10.5)
Chloride: 102 mEq/L (ref 96–112)
Creatinine, Ser: 0.77 mg/dL (ref 0.40–1.20)
GFR: 90.27 mL/min (ref >60.00)
Glucose, Bld: 103 mg/dL — ABNORMAL HIGH (ref 70–99)
Potassium: 4.3 mEq/L (ref 3.5–5.1)
Sodium: 133 mEq/L — ABNORMAL LOW (ref 135–145)
Total Bilirubin: 0.5 mg/dL (ref 0.2–1.2)
Total Protein: 6.5 g/dL (ref 6.0–8.3)

## 2023-02-01 ENCOUNTER — Telehealth: Payer: Self-pay | Admitting: Genetic Counselor

## 2023-02-01 ENCOUNTER — Inpatient Hospital Stay: Payer: 59 | Admitting: Genetic Counselor

## 2023-02-28 ENCOUNTER — Encounter: Payer: 59 | Admitting: Genetic Counselor

## 2023-02-28 ENCOUNTER — Other Ambulatory Visit: Payer: 59

## 2023-09-19 ENCOUNTER — Encounter: Payer: Self-pay | Admitting: Nurse Practitioner

## 2023-09-19 ENCOUNTER — Ambulatory Visit (INDEPENDENT_AMBULATORY_CARE_PROVIDER_SITE_OTHER): Admitting: Nurse Practitioner

## 2023-09-19 VITALS — BP 126/76 | HR 74 | Temp 97.1°F | Ht 68.0 in | Wt 186.0 lb

## 2023-09-19 DIAGNOSIS — R109 Unspecified abdominal pain: Secondary | ICD-10-CM

## 2023-09-19 DIAGNOSIS — N920 Excessive and frequent menstruation with regular cycle: Secondary | ICD-10-CM | POA: Diagnosis not present

## 2023-09-19 DIAGNOSIS — G8929 Other chronic pain: Secondary | ICD-10-CM | POA: Diagnosis not present

## 2023-09-19 DIAGNOSIS — N946 Dysmenorrhea, unspecified: Secondary | ICD-10-CM | POA: Insufficient documentation

## 2023-09-19 LAB — POCT URINALYSIS DIPSTICK
Bilirubin, UA: NEGATIVE
Blood, UA: 3
Glucose, UA: NEGATIVE
Ketones, UA: NEGATIVE
Leukocytes, UA: NEGATIVE
Nitrite, UA: NEGATIVE
Protein, UA: NEGATIVE
Spec Grav, UA: 1.015 (ref 1.010–1.025)
Urobilinogen, UA: 0.2 U/dL
pH, UA: 6 (ref 5.0–8.0)

## 2023-09-19 MED ORDER — NAPROXEN 500 MG PO TABS
500.0000 mg | ORAL_TABLET | Freq: Two times a day (BID) | ORAL | Status: AC
Start: 2023-09-19 — End: ?

## 2023-09-19 NOTE — Assessment & Plan Note (Signed)
 Chronic left flank pain, onset 1year ago, intermittent, worse with menstrual cycle- 2-3days before cycle, radiates to suprapubic region, worse yesterday for several hours then resolved spontaneously. No OVER THE COUNTER med used LMP 09/16/2023. Every 30 days. Husband with vasectomy Reports hx of menorrhagia and dysmenorrhea, worse in last 81yrs, Has clots with cycle-dime to quarter size. Change pads every 1-2hrs, use of heavy pad. No Fhx of uterine fibroid and endometriosis No dizziness or fatigue or palpitation, or pica  Uterine fibroid vs endometriosis? Advised to use naproxen for pain Ordered pelvic US 

## 2023-09-19 NOTE — Progress Notes (Signed)
 Established Patient Visit  Patient: Krista Jackson   DOB: 02-27-1971   52 y.o. Female  MRN: [id number] Visit Date: 09/19/2023  Subjective:    Chief Complaint  Patient presents with   Flank Pain    Left side flank pain x x 1 day. Pt now thinks that it has to do with her menstrual. No other symptoms.   HPI Flank pain, chronic Chronic left flank pain, onset 1year ago, intermittent, worse with menstrual cycle- 2-3days before cycle, radiates to suprapubic region, worse yesterday for several hours then resolved spontaneously. No OVER THE COUNTER med used LMP 09/16/2023. Every 30 days. Husband with vasectomy Reports hx of menorrhagia and dysmenorrhea, worse in last 70yrs, Has clots with cycle-dime to quarter size. Change pads every 1-2hrs, use of heavy pad. No Fhx of uterine fibroid and endometriosis No dizziness or fatigue or palpitation, or pica  Uterine fibroid vs endometriosis? Advised to use naproxen for pain Ordered pelvic US  Reviewed medical, surgical, and social history today  Medications: Outpatient Medications Prior to Visit  Medication Sig   Multiple Vitamin (MULTIVITAMIN) tablet Take 1 tablet by mouth daily.   Omega-3 Fatty Acids (FISH OIL) 1200 MG CAPS Take by mouth. otc   No facility-administered medications prior to visit.   Reviewed past medical and social history.   ROS per HPI above  Last CBC Lab Results  Component Value Date   WBC 6.6 02/17/2022   HGB 13.5 02/17/2022   HCT 40.1 02/17/2022   MCV 81.1 02/17/2022   MCH 28.5 10/06/2017   RDW 14.0 02/17/2022   PLT 248.0 02/17/2022   Last metabolic panel Lab Results  Component Value Date   GLUCOSE 103 (H) 01/20/2023   NA 133 (L) 01/20/2023   K 4.3 01/20/2023   CL 102 01/20/2023   CO2 25 01/20/2023   BUN 12 01/20/2023   CREATININE 0.77 01/20/2023   GFR 90.27 01/20/2023   CALCIUM 8.9 01/20/2023   PROT 6.5 01/20/2023   ALBUMIN 4.1 01/20/2023   BILITOT 0.5 01/20/2023   ALKPHOS 67  01/20/2023   AST 16 01/20/2023   ALT 12 01/20/2023   ANIONGAP 7 10/06/2017   Last thyroid functions Lab Results  Component Value Date   TSH 1.81 02/17/2022      Objective:  BP 126/76 (BP Location: Right Arm, Patient Position: Sitting, Cuff Size: Normal)   Pulse 74   Temp (!) 97.1 F (36.2 C)   Ht 5\' 8"  (1.727 m)   Wt 186 lb (84.4 kg)   LMP 09/16/2023   SpO2 98%   BMI 28.28 kg/m      Physical Exam Vitals and nursing note reviewed.  Cardiovascular:     Rate and Rhythm: Normal rate and regular rhythm.     Pulses: Normal pulses.     Heart sounds: Normal heart sounds.  Pulmonary:     Effort: Pulmonary effort is normal.     Breath sounds: Normal breath sounds.  Abdominal:     General: Bowel sounds are normal. There is no distension.     Palpations: Abdomen is soft.     Tenderness: There is no abdominal tenderness. There is no right CVA tenderness, left CVA tenderness or guarding.  Neurological:     Mental Status: She is alert and oriented to person, place, and time.     Results for orders placed or performed in visit on 09/19/23  POCT urinalysis dipstick  Result Value  Ref Range   Color, UA yellow    Clarity, UA cloudy    Glucose, UA Negative Negative   Bilirubin, UA neg    Ketones, UA neg    Spec Grav, UA 1.015 1.010 - 1.025   Blood, UA 3    pH, UA 6.0 5.0 - 8.0   Protein, UA Negative Negative   Urobilinogen, UA 0.2 0.2 or 1.0 E.U./dL   Nitrite, UA neg    Leukocytes, UA Negative Negative   Appearance     Odor        Assessment & Plan:    Problem List Items Addressed This Visit     Dysmenorrhea - Primary   Relevant Medications   naproxen (NAPROSYN) 500 MG tablet   Other Relevant Orders   US  PELVIC COMPLETE WITH TRANSVAGINAL   Flank pain, chronic   Chronic left flank pain, onset 1year ago, intermittent, worse with menstrual cycle- 2-3days before cycle, radiates to suprapubic region, worse yesterday for several hours then resolved spontaneously. No OVER  THE COUNTER med used LMP 09/16/2023. Every 30 days. Husband with vasectomy Reports hx of menorrhagia and dysmenorrhea, worse in last 34yrs, Has clots with cycle-dime to quarter size. Change pads every 1-2hrs, use of heavy pad. No Fhx of uterine fibroid and endometriosis No dizziness or fatigue or palpitation, or pica  Uterine fibroid vs endometriosis? Advised to use naproxen for pain Ordered pelvic US       Relevant Medications   naproxen (NAPROSYN) 500 MG tablet   Other Relevant Orders   POCT urinalysis dipstick (Completed)   Menorrhagia with regular cycle   Relevant Orders   US  PELVIC COMPLETE WITH TRANSVAGINAL   Return if symptoms worsen or fail to improve.     Kathrene Parents, NP

## 2023-09-19 NOTE — Patient Instructions (Signed)
 Use aleve or naproxen 500mg  every 12hrs as needed for pain. Start 2-3days prior to menstrual cycle. Take with food Urinalysis is normal Schedule appointment for pelvic US  when contacted

## 2023-09-27 ENCOUNTER — Encounter: Payer: Self-pay | Admitting: Nurse Practitioner

## 2023-09-27 ENCOUNTER — Ambulatory Visit
Admission: RE | Admit: 2023-09-27 | Discharge: 2023-09-27 | Disposition: A | Discharge status: Home / Self Care | Source: Ambulatory Visit | Attending: Nurse Practitioner | Admitting: Nurse Practitioner

## 2023-09-27 DIAGNOSIS — N946 Dysmenorrhea, unspecified: Secondary | ICD-10-CM

## 2023-09-27 DIAGNOSIS — N920 Excessive and frequent menstruation with regular cycle: Secondary | ICD-10-CM

## 2023-12-18 ENCOUNTER — Encounter: Payer: 59 | Admitting: Dermatology

## 2024-01-10 ENCOUNTER — Encounter: Payer: 59 | Admitting: Nurse Practitioner
